# Patient Record
Sex: Female | Born: 1940 | Race: White | Hispanic: No | Smoking: Former smoker
Health system: Southern US, Community
[De-identification: ages and names within clinical notes are randomized; demographics above are authoritative.]

## PROBLEM LIST (undated history)

## (undated) DIAGNOSIS — R519 Headache, unspecified: Secondary | ICD-10-CM

## (undated) DIAGNOSIS — J449 Chronic obstructive pulmonary disease, unspecified: Secondary | ICD-10-CM

## (undated) DIAGNOSIS — I341 Nonrheumatic mitral (valve) prolapse: Secondary | ICD-10-CM

## (undated) DIAGNOSIS — J45909 Unspecified asthma, uncomplicated: Secondary | ICD-10-CM

## (undated) DIAGNOSIS — D649 Anemia, unspecified: Secondary | ICD-10-CM

## (undated) DIAGNOSIS — C060 Malignant neoplasm of cheek mucosa: Secondary | ICD-10-CM

## (undated) DIAGNOSIS — K219 Gastro-esophageal reflux disease without esophagitis: Secondary | ICD-10-CM

## (undated) DIAGNOSIS — Z973 Presence of spectacles and contact lenses: Secondary | ICD-10-CM

## (undated) DIAGNOSIS — R011 Cardiac murmur, unspecified: Secondary | ICD-10-CM

## (undated) DIAGNOSIS — E785 Hyperlipidemia, unspecified: Secondary | ICD-10-CM

## (undated) DIAGNOSIS — F32A Depression, unspecified: Secondary | ICD-10-CM

## (undated) DIAGNOSIS — Z720 Tobacco use: Secondary | ICD-10-CM

## (undated) DIAGNOSIS — F419 Anxiety disorder, unspecified: Secondary | ICD-10-CM

## (undated) HISTORY — PX: EYE SURGERY: SHX253

## (undated) HISTORY — PX: CARDIAC CATHETERIZATION: SHX172

## (undated) HISTORY — PX: DILATION AND CURETTAGE OF UTERUS: SHX78

---

## 2001-03-12 ENCOUNTER — Ambulatory Visit (HOSPITAL_COMMUNITY): Admission: RE | Admit: 2001-03-12 | Discharge: 2001-03-12 | Payer: Self-pay | Admitting: Internal Medicine

## 2005-01-10 ENCOUNTER — Ambulatory Visit: Payer: Self-pay | Admitting: Internal Medicine

## 2010-07-25 DIAGNOSIS — C060 Malignant neoplasm of cheek mucosa: Secondary | ICD-10-CM

## 2010-07-25 HISTORY — DX: Malignant neoplasm of cheek mucosa: C06.0

## 2010-10-24 HISTORY — PX: EXCISION OF ORAL TUMOR: SHX6269

## 2010-10-29 ENCOUNTER — Other Ambulatory Visit (HOSPITAL_COMMUNITY): Payer: Self-pay | Admitting: Otolaryngology

## 2010-10-29 ENCOUNTER — Encounter (HOSPITAL_COMMUNITY)
Admission: RE | Admit: 2010-10-29 | Discharge: 2010-10-29 | Disposition: A | Discharge status: Home / Self Care | Payer: Medicare Other | Source: Ambulatory Visit | Attending: Otolaryngology | Admitting: Otolaryngology

## 2010-10-29 ENCOUNTER — Ambulatory Visit (HOSPITAL_COMMUNITY)
Admission: RE | Admit: 2010-10-29 | Discharge: 2010-10-29 | Disposition: A | Discharge status: Home / Self Care | Payer: Medicare Other | Source: Ambulatory Visit | Attending: Otolaryngology | Admitting: Otolaryngology

## 2010-10-29 DIAGNOSIS — C069 Malignant neoplasm of mouth, unspecified: Secondary | ICD-10-CM | POA: Insufficient documentation

## 2010-10-29 DIAGNOSIS — Z01818 Encounter for other preprocedural examination: Secondary | ICD-10-CM | POA: Insufficient documentation

## 2010-10-29 DIAGNOSIS — J438 Other emphysema: Secondary | ICD-10-CM | POA: Insufficient documentation

## 2010-10-29 DIAGNOSIS — Z01812 Encounter for preprocedural laboratory examination: Secondary | ICD-10-CM | POA: Insufficient documentation

## 2010-10-29 LAB — SURGICAL PCR SCREEN
MRSA, PCR: NEGATIVE
Staphylococcus aureus: NEGATIVE

## 2010-10-29 LAB — CBC
Hemoglobin: 13.4 g/dL (ref 12.0–15.0)
RBC: 4.41 MIL/uL (ref 3.87–5.11)

## 2010-11-02 ENCOUNTER — Ambulatory Visit (HOSPITAL_COMMUNITY)
Admission: RE | Admit: 2010-11-02 | Discharge: 2010-11-02 | Disposition: A | Discharge status: Home / Self Care | Payer: Medicare Other | Source: Ambulatory Visit | Attending: Otolaryngology | Admitting: Otolaryngology

## 2010-11-02 DIAGNOSIS — K219 Gastro-esophageal reflux disease without esophagitis: Secondary | ICD-10-CM | POA: Insufficient documentation

## 2010-11-02 DIAGNOSIS — Z5309 Procedure and treatment not carried out because of other contraindication: Secondary | ICD-10-CM | POA: Insufficient documentation

## 2010-11-02 DIAGNOSIS — I059 Rheumatic mitral valve disease, unspecified: Secondary | ICD-10-CM | POA: Insufficient documentation

## 2010-11-02 DIAGNOSIS — C051 Malignant neoplasm of soft palate: Secondary | ICD-10-CM | POA: Insufficient documentation

## 2010-11-02 DIAGNOSIS — D3701 Neoplasm of uncertain behavior of lip: Secondary | ICD-10-CM | POA: Insufficient documentation

## 2010-11-02 DIAGNOSIS — C060 Malignant neoplasm of cheek mucosa: Secondary | ICD-10-CM | POA: Insufficient documentation

## 2010-11-02 DIAGNOSIS — D3709 Neoplasm of uncertain behavior of other specified sites of the oral cavity: Secondary | ICD-10-CM | POA: Insufficient documentation

## 2010-11-02 DIAGNOSIS — J45909 Unspecified asthma, uncomplicated: Secondary | ICD-10-CM | POA: Insufficient documentation

## 2010-11-02 DIAGNOSIS — R197 Diarrhea, unspecified: Secondary | ICD-10-CM | POA: Insufficient documentation

## 2010-11-08 ENCOUNTER — Other Ambulatory Visit: Payer: Self-pay | Admitting: Otolaryngology

## 2010-11-08 ENCOUNTER — Observation Stay (HOSPITAL_COMMUNITY)
Admission: RE | Admit: 2010-11-08 | Discharge: 2010-11-09 | Disposition: A | Discharge status: Home / Self Care | Payer: Medicare Other | Source: Ambulatory Visit | Attending: Otolaryngology | Admitting: Otolaryngology

## 2010-11-08 DIAGNOSIS — C060 Malignant neoplasm of cheek mucosa: Secondary | ICD-10-CM | POA: Insufficient documentation

## 2010-11-08 DIAGNOSIS — J45909 Unspecified asthma, uncomplicated: Secondary | ICD-10-CM | POA: Insufficient documentation

## 2010-11-08 DIAGNOSIS — K219 Gastro-esophageal reflux disease without esophagitis: Secondary | ICD-10-CM | POA: Insufficient documentation

## 2010-11-08 DIAGNOSIS — L439 Lichen planus, unspecified: Principal | ICD-10-CM | POA: Insufficient documentation

## 2010-11-08 DIAGNOSIS — I059 Rheumatic mitral valve disease, unspecified: Secondary | ICD-10-CM | POA: Insufficient documentation

## 2010-11-23 NOTE — Op Note (Signed)
Meredith Stewart, Meredith Stewart                ACCOUNT NO.:  [id number]  MEDICAL RECORD NO.:  [id number]           PATIENT TYPE:  O  LOCATION:  5128                         FACILITY:  [facility]  PHYSICIAN:  Braylei Totino H. 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  PROCEDURE:  Resection of buccogingival tumor with marginal mandibulectomy and CO2 laser ablation of left buccal mucosal lichen planus.  SURGEON:  Meris Reede H. Pollyann Kennedy, MD  ANESTHESIA:  General endotracheal anesthesia was used.  COMPLICATIONS:  None.  BLOOD LOSS:  About 200 mL.  FINDINGS:  Ulcerative and slightly raised nearly circular mass involving the lower posterior or buccal mucosa on the right with continuation onto the posterior alveolar ridge.  The resection margin was through the most posterior tooth on that side, this tooth was removed as well to facilitate the margin.  Specimen to pathology one is right buccogingival tumor, long double suture marked the anterior buccal margin and the short double suture marked the medial margin.  Frozen section all four quadrants sent separately were all negative for tumor.  The medial margin had some dysplastic mucosa, but no carcinoma.  REFERRING PHYSICIAN:  Hewitt Blade, DDS  HISTORY:  This is a 70 year old lady with about a 30-year history of chronic lichen planus.  There are some changes noted on the right side recently and the biopsy confirmed that this was squamous cell carcinoma. Risks, benefits, alternatives, and complications to the procedure were explained to the patient, seemed to understand and agreed to surgery.  PROCEDURE:  The patient was taken to the operating room, placed in the operating table in supine position.  Following induction of  general endotracheal anesthesia, the patient was prepped and draped in standard fashion.  A left-sided bite block was used to keep the teeth apart. With retraction of the cheek and tongue, the tumor was identified and surrounding the entire lesion mucosal marks were made with the electrocautery to mark the proposed incisions keeping adequate margins. The mucosa was all incised around the entire lesion.  The posterior most tooth which believe was a premolar was extracted using dental ligament dissectors and then tooth extraction forceps.  The entire tooth came with the root nicely.  The mucosal cuts were continued down through the submucosal tissue along the mandible down through the periosteum and more laterally into the buccal fat pad.  The entire soft tissue dissection was continued down to the mandible.  The mandibulectomy was then performed using a side cutting bur and the TPS drill and was completed using a curved osteotome.  Once the bone was separated, the medial deep tissue cuts were created using electrocautery.  The specimen was sent for pathologic evaluation.  The four quadrant frozen section samples were taken as well.  A 4-0 silk ties, bipolar cautery and bone wax were used for hemostasis.  The edges of the mandible were rounded using a round cutting bur.  The wound was irrigated.  The primary closure was then accomplished using interrupted 3-0 Vicryl suture. There was good apposition of the tissues.  Because of the mandibulectomy, the defect was nicely.  There was no tension on the wound.  There was no significant bleeding.  The oral cavity was irrigated with saline.  Left CO2 laser ablation, lichen planus.  The CO2 laser handpiece with the setting of 4 watts continuous was then used to ablate the lichen planus in the left buccal mucosa.  There was an irregularly shaped area approximately 3-4 cm in greatest dimension.  There was no ulceration or palpable mass and nothing  that required biopsy.  The mucosa was ablated with laser and the submucosal tissue looked healthy.  There was no bleeding.  Wet high protectors and wet towels were used for protection of the face and the eyes.  The patient was awakened, extubated, and transferred to recovery in stable condition.     Lucy Woolever H. Pollyann Kennedy, MD     JHR/MEDQ  D:  11/08/2010  T:  11/08/2010  Job:  [id number]  cc:   Hewitt Blade, D.D.S.  Electronically Signed by Serena Colonel MD on 11/23/2010 09:24:29 PM

## 2010-12-31 ENCOUNTER — Encounter: Payer: Self-pay | Admitting: Internal Medicine

## 2011-06-08 ENCOUNTER — Ambulatory Visit (INDEPENDENT_AMBULATORY_CARE_PROVIDER_SITE_OTHER): Payer: Medicare Other | Admitting: Ophthalmology

## 2011-06-08 DIAGNOSIS — H43819 Vitreous degeneration, unspecified eye: Secondary | ICD-10-CM

## 2011-06-08 DIAGNOSIS — H3530 Unspecified macular degeneration: Secondary | ICD-10-CM

## 2011-06-08 DIAGNOSIS — H2510 Age-related nuclear cataract, unspecified eye: Secondary | ICD-10-CM

## 2011-11-01 IMAGING — CR DG CHEST 2V
2 series · 2 of 2 positions shown · non-contrast
Comparison: None.

CLINICAL DATA: Emphysema.  Preop.  Oral cancer.  145.9.

CHEST - 2 VIEW

[view not recorded (1 of 2)]
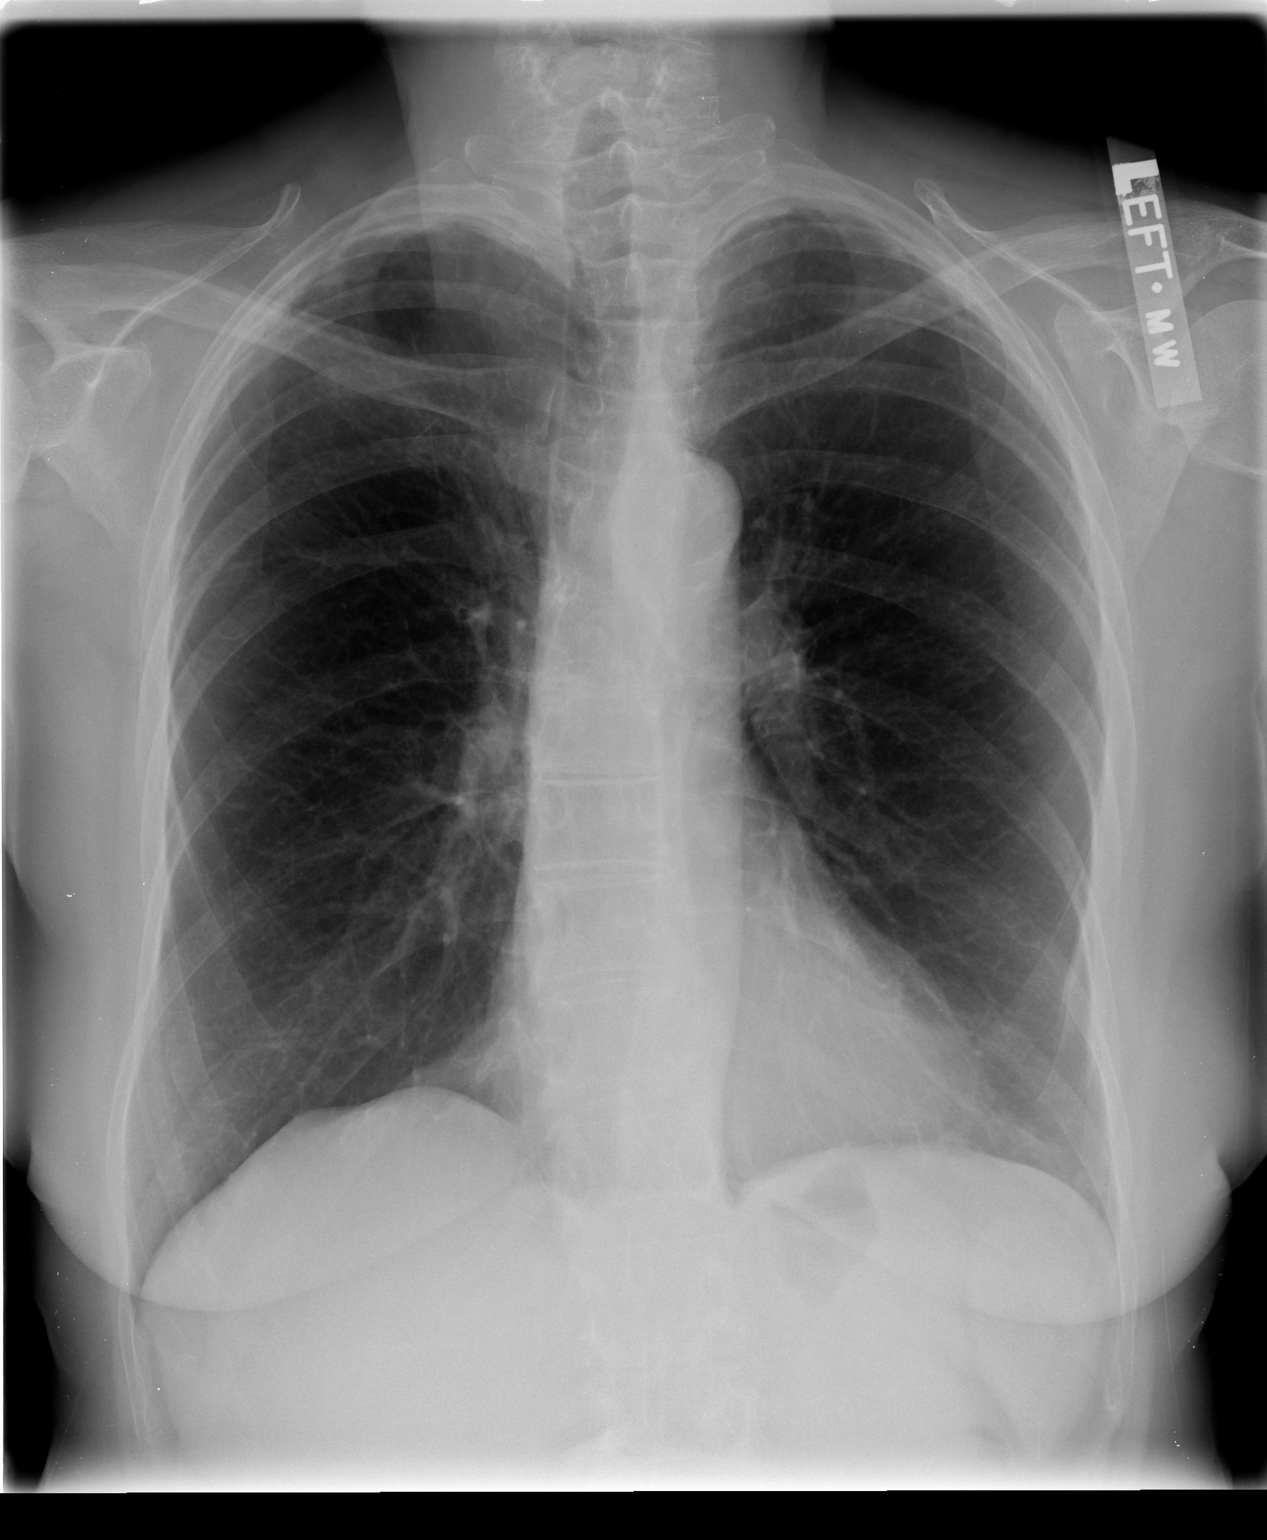

[view not recorded (2 of 2)]
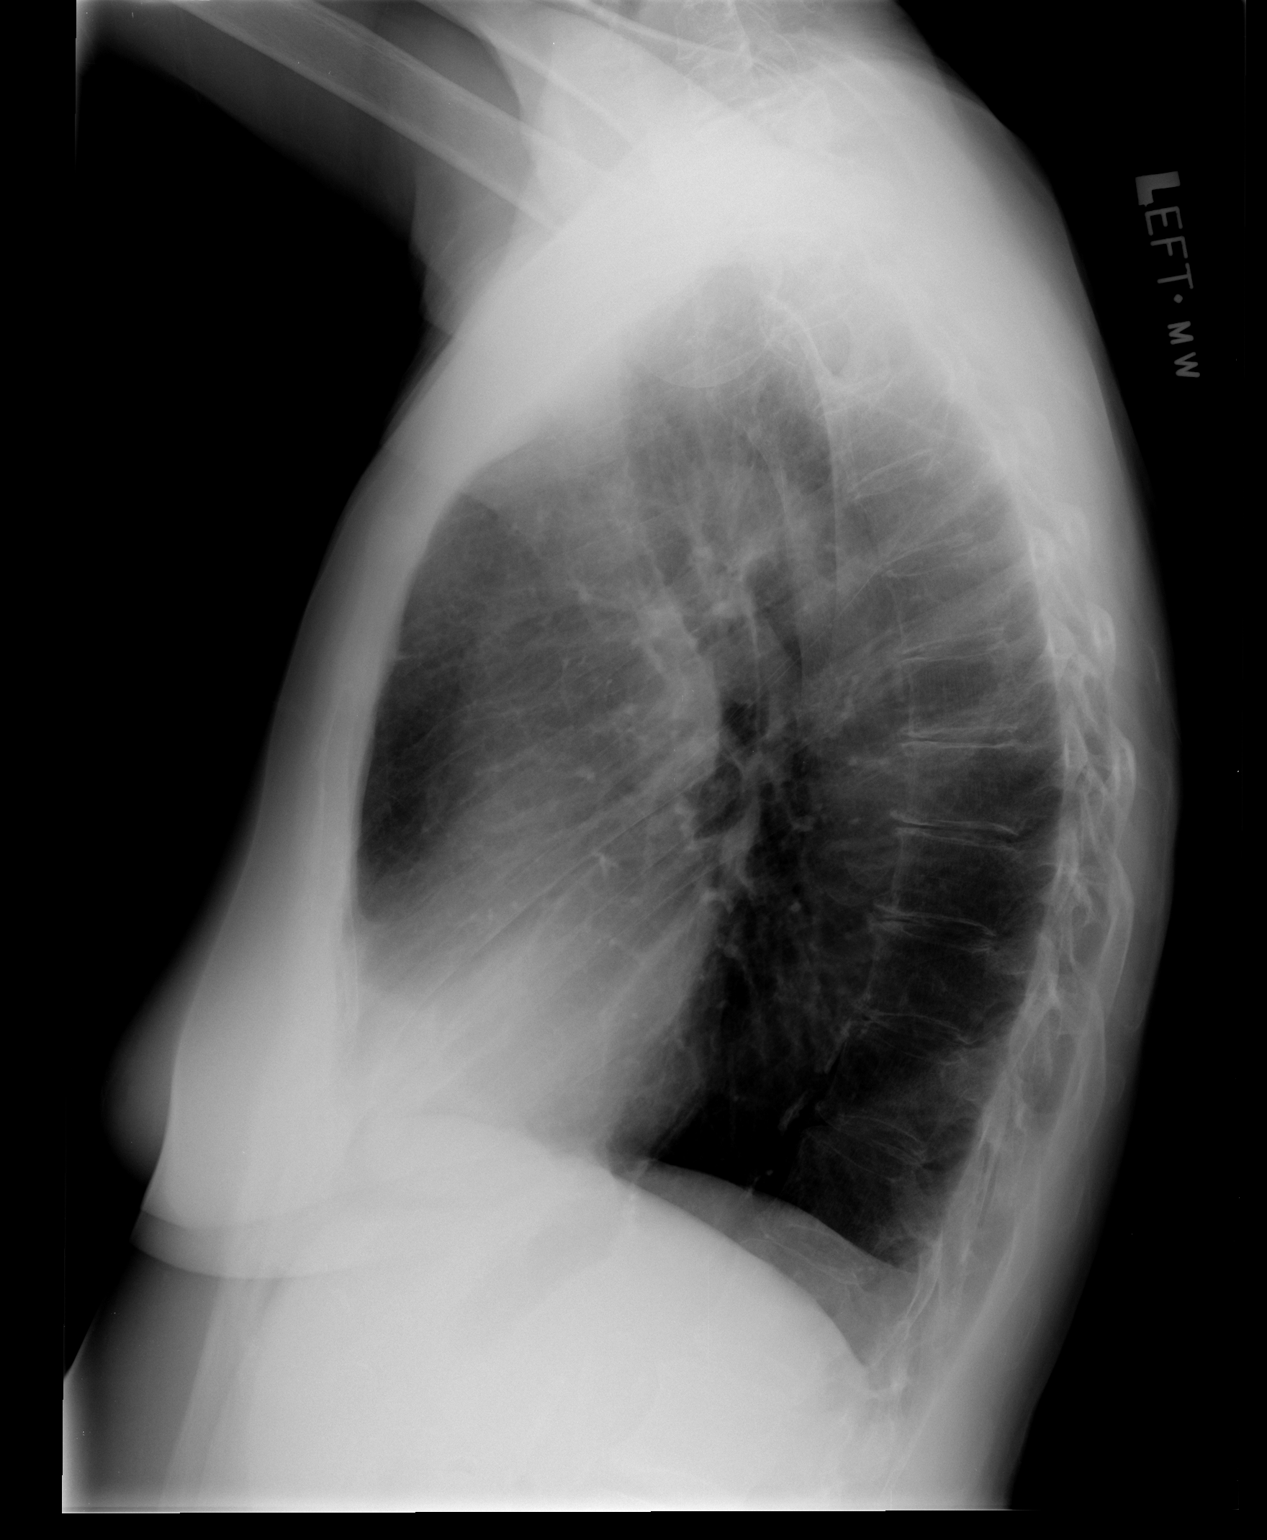

[2 of 2 positions shown; findings below may reference images not displayed]

FINDINGS: The heart size is normal.  Emphysematous changes are
present in the lungs.  There is some scarring at the apices
bilaterally.  No focal nodule or airspace disease is present.  Mild
degenerative changes are noted in the thoracolumbar spine along
with slight curvature.  Atherosclerotic calcifications are noted in
the descending aorta.  The visualized soft tissues and bony thorax
are otherwise unremarkable.
IMPRESSION: 1.  Emphysema.
2.  No acute or focal airspace disease.
3.  Atherosclerosis.

## 2011-12-06 ENCOUNTER — Ambulatory Visit (INDEPENDENT_AMBULATORY_CARE_PROVIDER_SITE_OTHER): Payer: Medicare Other | Admitting: Ophthalmology

## 2011-12-06 DIAGNOSIS — H43819 Vitreous degeneration, unspecified eye: Secondary | ICD-10-CM

## 2011-12-06 DIAGNOSIS — H2510 Age-related nuclear cataract, unspecified eye: Secondary | ICD-10-CM

## 2011-12-06 DIAGNOSIS — H3530 Unspecified macular degeneration: Secondary | ICD-10-CM

## 2012-05-17 ENCOUNTER — Encounter (HOSPITAL_COMMUNITY): Payer: Self-pay | Admitting: Internal Medicine

## 2012-05-17 ENCOUNTER — Observation Stay (HOSPITAL_COMMUNITY)
Admission: AD | Admit: 2012-05-17 | Discharge: 2012-05-18 | Disposition: A | Discharge status: Home / Self Care | Payer: Medicare Other | Source: Other Acute Inpatient Hospital | Attending: Cardiology | Admitting: Cardiology

## 2012-05-17 ENCOUNTER — Observation Stay (HOSPITAL_COMMUNITY): Payer: Medicare Other

## 2012-05-17 DIAGNOSIS — K219 Gastro-esophageal reflux disease without esophagitis: Secondary | ICD-10-CM | POA: Insufficient documentation

## 2012-05-17 DIAGNOSIS — R9431 Abnormal electrocardiogram [ECG] [EKG]: Secondary | ICD-10-CM | POA: Insufficient documentation

## 2012-05-17 DIAGNOSIS — F17200 Nicotine dependence, unspecified, uncomplicated: Secondary | ICD-10-CM | POA: Insufficient documentation

## 2012-05-17 DIAGNOSIS — E876 Hypokalemia: Secondary | ICD-10-CM | POA: Insufficient documentation

## 2012-05-17 DIAGNOSIS — R079 Chest pain, unspecified: Principal | ICD-10-CM | POA: Insufficient documentation

## 2012-05-17 DIAGNOSIS — R7989 Other specified abnormal findings of blood chemistry: Secondary | ICD-10-CM | POA: Diagnosis present

## 2012-05-17 DIAGNOSIS — Z720 Tobacco use: Secondary | ICD-10-CM | POA: Insufficient documentation

## 2012-05-17 DIAGNOSIS — E785 Hyperlipidemia, unspecified: Secondary | ICD-10-CM | POA: Diagnosis present

## 2012-05-17 DIAGNOSIS — R945 Abnormal results of liver function studies: Secondary | ICD-10-CM | POA: Diagnosis present

## 2012-05-17 DIAGNOSIS — R0789 Other chest pain: Secondary | ICD-10-CM | POA: Diagnosis present

## 2012-05-17 HISTORY — DX: Malignant neoplasm of cheek mucosa: C06.0

## 2012-05-17 HISTORY — DX: Gastro-esophageal reflux disease without esophagitis: K21.9

## 2012-05-17 HISTORY — DX: Hyperlipidemia, unspecified: E78.5

## 2012-05-17 HISTORY — DX: Tobacco use: Z72.0

## 2012-05-17 MED ORDER — ACETAMINOPHEN 325 MG PO TABS
650.0000 mg | ORAL_TABLET | ORAL | Status: DC | PRN
  Administered 2012-05-18: 650 mg via ORAL
  Filled 2012-05-17: qty 2

## 2012-05-17 MED ORDER — ASPIRIN 81 MG PO CHEW
324.0000 mg | CHEWABLE_TABLET | ORAL | Status: AC
  Administered 2012-05-18: 324 mg via ORAL
  Filled 2012-05-17: qty 4

## 2012-05-17 MED ORDER — OCUVITE-LUTEIN PO CAPS
1.0000 | ORAL_CAPSULE | Freq: Every day | ORAL | Status: DC
  Administered 2012-05-18: 1 via ORAL
  Filled 2012-05-17: qty 1

## 2012-05-17 MED ORDER — SODIUM CHLORIDE 0.9 % IJ SOLN: 3.0000 mL | INTRAMUSCULAR | Status: DC | PRN

## 2012-05-17 MED ORDER — VITAMIN D 50 MCG (2000 UT) PO TABS: 2000.0000 [IU] | ORAL_TABLET | Freq: Every day | ORAL | Status: DC

## 2012-05-17 MED ORDER — ASPIRIN 300 MG RE SUPP
300.0000 mg | RECTAL | Status: AC
  Filled 2012-05-17: qty 1

## 2012-05-17 MED ORDER — VITAMIN D3 25 MCG (1000 UNIT) PO TABS
2000.0000 [IU] | ORAL_TABLET | Freq: Every day | ORAL | Status: DC
  Administered 2012-05-18: 2000 [IU] via ORAL
  Filled 2012-05-17: qty 2

## 2012-05-17 MED ORDER — ATORVASTATIN CALCIUM 80 MG PO TABS
80.0000 mg | ORAL_TABLET | Freq: Every day | ORAL | Status: DC
  Administered 2012-05-18: 80 mg via ORAL
  Filled 2012-05-17: qty 1

## 2012-05-17 MED ORDER — LORATADINE 10 MG PO TABS
10.0000 mg | ORAL_TABLET | Freq: Every day | ORAL | Status: DC
  Administered 2012-05-18: 10 mg via ORAL
  Filled 2012-05-17: qty 1

## 2012-05-17 MED ORDER — FAMOTIDINE 20 MG PO TABS
20.0000 mg | ORAL_TABLET | Freq: Every day | ORAL | Status: DC
  Administered 2012-05-18: 20 mg via ORAL
  Filled 2012-05-17 (×2): qty 1

## 2012-05-17 MED ORDER — ALBUTEROL SULFATE HFA 108 (90 BASE) MCG/ACT IN AERS
1.0000 | INHALATION_SPRAY | Freq: Four times a day (QID) | RESPIRATORY_TRACT | Status: DC | PRN
  Administered 2012-05-18: 1 via RESPIRATORY_TRACT
  Filled 2012-05-17: qty 6.7

## 2012-05-17 MED ORDER — SODIUM CHLORIDE 0.9 % IJ SOLN
3.0000 mL | Freq: Two times a day (BID) | INTRAMUSCULAR | Status: DC
  Administered 2012-05-18 (×2): 3 mL via INTRAVENOUS

## 2012-05-17 MED ORDER — ONDANSETRON HCL 4 MG/2ML IJ SOLN: 4.0000 mg | Freq: Four times a day (QID) | INTRAMUSCULAR | Status: DC | PRN

## 2012-05-17 MED ORDER — FLUTICASONE-SALMETEROL 250-50 MCG/DOSE IN AEPB
1.0000 | INHALATION_SPRAY | Freq: Two times a day (BID) | RESPIRATORY_TRACT | Status: DC
  Administered 2012-05-18: 1 via RESPIRATORY_TRACT
  Filled 2012-05-17: qty 14

## 2012-05-17 MED ORDER — SODIUM CHLORIDE 0.9 % IV SOLN: 250.0000 mL | INTRAVENOUS | Status: DC | PRN

## 2012-05-17 MED ORDER — ASPIRIN EC 81 MG PO TBEC
81.0000 mg | DELAYED_RELEASE_TABLET | Freq: Every day | ORAL | Status: DC
  Filled 2012-05-17: qty 1

## 2012-05-17 NOTE — H&P (Addendum)
Meredith Stewart is an 71 y.o. female.   Chief Complaint: chest pain and + Lexiscan Perfusion study HPI: 71 yo woman with PMH only for GERD and squamous cell carcinoma of buccal mucosa with 35 years tobacco use, quit 12 years ago who had early morning chest pain day prior to transfer here at [name]. She tells me she had chest pain for approximately 2 hours - substernal and in her abdomen - somewhat like GERD she's had but more severe. There was some associated retrosternal back pain. She took some NTG at the hospital because of some chest pain recurrence l but it dropped her blood pressure and per chart review may have caused ST changes. She was observed/admitted to [name] and apparently had negative cardiac biomarkers x 3 (I reviewed 2 in our chart here) and had a lexiscan myocardial perfusion study which revealed a small fixed defect (? Artifact) but positive ECG changes with lexiscan administration and a concern for balanced ischemia so she was transferred to [name] for a left heart catheterization. Otherwise, she sleeps on 1 pillow, can walk in excess of 1 mile and has no problems walking up stairs.   Past Medical History  Diagnosis Date  . GERD (gastroesophageal reflux disease)   . Squamous cell carcinoma of buccal mucosa 2012  . Tobacco abuse     smoked x 35 years, quit 12 years ago    No past surgical history on file. No known family history of HTN, T2DM or CAD No family history on file. Social History:  does not have a smoking history on file. She does not have any smokeless tobacco history on file. Her alcohol and drug histories not on file.  Allergies:  Allergies  Allergen Reactions  . Sulfa Antibiotics     unknown    Medications Prior to Admission  Medication Sig Dispense Refill  . albuterol (PROVENTIL HFA;VENTOLIN HFA) 108 (90 BASE) MCG/ACT inhaler Inhale 1 puff into the lungs daily.      . Cholecalciferol (VITAMIN D) 2000 UNITS tablet Take 2,000 Units by mouth  daily.      . famotidine (PEPCID) 20 MG tablet Take 20 mg by mouth daily as needed. For upset stomach      . fish oil-omega-3 fatty acids 1000 MG capsule Take 1 g by mouth daily.      . Fluticasone-Salmeterol (ADVAIR) 250-50 MCG/DOSE AEPB Inhale 1 puff into the lungs every 12 (twelve) hours.      Marland Kitchen loratadine (CLARITIN) 10 MG tablet Take 10 mg by mouth daily.      . multivitamin-lutein (OCUVITE-LUTEIN) CAPS Take 1 capsule by mouth daily.        No results found for this or any previous visit (from the past 48 hour(s)). No results found.  Review of Systems  Constitutional: Negative for fever, chills and weight loss.  HENT: Negative for hearing loss, ear pain, neck pain and tinnitus.   Eyes: Negative for blurred vision, double vision and photophobia.  Respiratory: Negative for cough, hemoptysis, sputum production and shortness of breath.   Cardiovascular: Positive for chest pain. Negative for palpitations, orthopnea, claudication and leg swelling.  Gastrointestinal: Positive for heartburn and vomiting. Negative for nausea and abdominal pain.  Genitourinary: Negative for dysuria, urgency and frequency.  Musculoskeletal: Negative for myalgias.  Skin: Negative for itching and rash.  Neurological: Positive for headaches. Negative for dizziness, tingling and tremors.  Endo/Heme/Allergies: Negative for environmental allergies and polydipsia. Does not bruise/bleed easily.  Psychiatric/Behavioral: Negative for depression,  suicidal ideas and substance abuse.    Blood pressure 150/80, pulse 75, temperature 99.7 F (37.6 C), temperature source Oral, weight 65.681 kg (144 lb 12.8 oz), SpO2 95.00%. Physical Exam  Nursing note and vitals reviewed. Constitutional: She is oriented to person, place, and time. She appears well-developed and well-nourished. No distress.  HENT:  Head: Normocephalic and atraumatic.  Nose: Nose normal.  Mouth/Throat: No oropharyngeal exudate.  Eyes: Conjunctivae normal  and EOM are normal. Pupils are equal, round, and reactive to light. No scleral icterus.  Neck: Normal range of motion. Neck supple. No JVD present. No tracheal deviation present. No thyromegaly present.  Cardiovascular: Normal rate, regular rhythm, normal heart sounds and intact distal pulses.  Exam reveals no gallop and no friction rub.   No murmur heard. Respiratory: Effort normal and breath sounds normal. No respiratory distress. She has no wheezes. She has no rales.  GI: Soft. Bowel sounds are normal. She exhibits no distension. There is no tenderness. There is no rebound.  Musculoskeletal: Normal range of motion. She exhibits no edema and no tenderness.  Neurological: She is alert and oriented to person, place, and time. She has normal reflexes. No cranial nerve deficit.  Skin: Skin is warm and dry. No rash noted. She is not diaphoretic. No erythema.  Psychiatric: She has a normal mood and affect. Her behavior is normal.    Labs reviewed from OSH - bun/cr 0.83, troponin negative x2 that I saw CTA negative for PE; 3 mm LUL pulmonary nodule Nuclear study read reviewed - in chart: small fixed defect apical, anterior, apex - ? Attenuation artifact EF 67% + ECG abnormalities mentioned with lexiscan administration - HR response to 145, no symptoms Concern for balanced ischemia  Problem List Chest Pain with + Lexiscan Nuclear study GERD Prior tobacco abuse Prior Squamous Cell carcinoma of buccal mucosa  Assessment/Plan 71 yo woman with prior tobacco abuse, GERD and chest pain with + ECG changes on Lexiscan administration and concern for balanced ischemia transferred to [facility] for Cardiac Catheterization. She has significant risk factors with age and tobacco abuse with + nuclear study. Will plan for cardiac catheterization in the morning. She has a remote history of blood pressure decreased in '86 with contrast administration related to kidney evaluation as a possible donor as a family  member. However, she tolerated her CTA at [name] without difficulty. The procedure, risks and benefits of cardiac catheterization were discussed with Ms. Mcelhiney and her husband at the end of my history and physical examination.  - NPO after midnight - asa 81 mg PO daily, 325 mg to be given now just in case - continue PPI - telemetry - PRN percocet for pain - hba1c, tsh, coags, cbc, cmp, EKG, Chest x-ray  - defer echo given EF from nuclear study and possible LV gram tomrorow. No murmurs on exam.  - no pharmacologic VTE ppx given ambulatory status and likely heparinization with catheterization in AM  Benny Deutschman 05/17/2012, 11:02 PM  AST/ALT noted to be up - atorvastatin discontinued although she appeared to have received dose this evening. Will need to follow up LFTS to assess etiology. ? Abd u/s if not in the system.

## 2012-05-18 ENCOUNTER — Encounter (HOSPITAL_COMMUNITY)
Admission: AD | Disposition: A | Discharge status: Home / Self Care | Payer: Self-pay | Source: Other Acute Inpatient Hospital | Attending: Cardiology

## 2012-05-18 ENCOUNTER — Encounter (HOSPITAL_COMMUNITY): Payer: Self-pay

## 2012-05-18 DIAGNOSIS — R0789 Other chest pain: Secondary | ICD-10-CM | POA: Diagnosis present

## 2012-05-18 DIAGNOSIS — E785 Hyperlipidemia, unspecified: Secondary | ICD-10-CM

## 2012-05-18 DIAGNOSIS — R079 Chest pain, unspecified: Secondary | ICD-10-CM

## 2012-05-18 DIAGNOSIS — R945 Abnormal results of liver function studies: Secondary | ICD-10-CM | POA: Diagnosis present

## 2012-05-18 HISTORY — PX: LEFT HEART CATHETERIZATION WITH CORONARY ANGIOGRAM: SHX5451

## 2012-05-18 LAB — HEMOGLOBIN A1C: Hgb A1c MFr Bld: 5.5 % (ref <5.7)

## 2012-05-18 LAB — COMPREHENSIVE METABOLIC PANEL
Albumin: 3.7 g/dL (ref 3.5–5.2)
Alkaline Phosphatase: 159 U/L — ABNORMAL HIGH (ref 39–117)
BUN: 8 mg/dL (ref 6–23)
Chloride: 94 mEq/L — ABNORMAL LOW (ref 96–112)
Creatinine, Ser: 0.56 mg/dL (ref 0.50–1.10)
GFR calc Af Amer: >90 mL/min (ref >90)
Glucose, Bld: 113 mg/dL — ABNORMAL HIGH (ref 70–99)
Potassium: 3.3 mEq/L — ABNORMAL LOW (ref 3.5–5.1)
Total Bilirubin: 0.6 mg/dL (ref 0.3–1.2)

## 2012-05-18 LAB — CBC WITH DIFFERENTIAL/PLATELET
Basophils Absolute: 0 10*3/uL (ref 0.0–0.1)
Basophils Relative: 1 % (ref 0–1)
Eosinophils Relative: 3 % (ref 0–5)
HCT: 36 % (ref 36.0–46.0)
Hemoglobin: 12.6 g/dL (ref 12.0–15.0)
Lymphocytes Relative: 22 % (ref 12–46)
MCHC: 35 g/dL (ref 30.0–36.0)
MCV: 87.4 fL (ref 78.0–100.0)
Monocytes Absolute: 0.5 10*3/uL (ref 0.1–1.0)
Monocytes Relative: 7 % (ref 3–12)
Neutro Abs: 5.3 10*3/uL (ref 1.7–7.7)
RDW: 12.4 % (ref 11.5–15.5)

## 2012-05-18 LAB — BASIC METABOLIC PANEL
Calcium: 9.4 mg/dL (ref 8.4–10.5)
Calcium: 9.9 mg/dL (ref 8.4–10.5)
GFR calc Af Amer: >90 mL/min (ref >90)
GFR calc Af Amer: >90 mL/min (ref >90)
GFR calc non Af Amer: >90 mL/min (ref >90)
GFR calc non Af Amer: 88 mL/min — ABNORMAL LOW (ref >90)
Glucose, Bld: 96 mg/dL (ref 70–99)
Potassium: 3.7 mEq/L (ref 3.5–5.1)
Sodium: 131 mEq/L — ABNORMAL LOW (ref 135–145)
Sodium: 132 mEq/L — ABNORMAL LOW (ref 135–145)

## 2012-05-18 LAB — MAGNESIUM: Magnesium: 1.7 mg/dL (ref 1.5–2.5)

## 2012-05-18 LAB — LIPID PANEL
HDL: 70 mg/dL (ref >39)
LDL Cholesterol: 134 mg/dL — ABNORMAL HIGH (ref 0–99)
Total CHOL/HDL Ratio: 3.2 RATIO
VLDL: 17 mg/dL (ref 0–40)

## 2012-05-18 SURGERY — LEFT HEART CATHETERIZATION WITH CORONARY ANGIOGRAM
Anesthesia: LOCAL

## 2012-05-18 MED ORDER — MIDAZOLAM HCL 2 MG/2ML IJ SOLN
INTRAMUSCULAR | Status: AC
  Filled 2012-05-18: qty 2

## 2012-05-18 MED ORDER — SODIUM CHLORIDE 0.9 % IJ SOLN: 3.0000 mL | INTRAMUSCULAR | Status: DC | PRN

## 2012-05-18 MED ORDER — HEPARIN (PORCINE) IN NACL 2-0.9 UNIT/ML-% IJ SOLN
INTRAMUSCULAR | Status: AC
  Filled 2012-05-18: qty 1000

## 2012-05-18 MED ORDER — VERAPAMIL HCL 2.5 MG/ML IV SOLN
INTRAVENOUS | Status: AC
  Filled 2012-05-18: qty 2

## 2012-05-18 MED ORDER — SODIUM CHLORIDE 0.45 % IV SOLN: INTRAVENOUS | Status: AC

## 2012-05-18 MED ORDER — SODIUM CHLORIDE 0.9 % IV SOLN: 250.0000 mL | INTRAVENOUS | Status: DC | PRN

## 2012-05-18 MED ORDER — SODIUM CHLORIDE 0.9 % IV SOLN
1.0000 mL/kg/h | INTRAVENOUS | Status: DC
  Administered 2012-05-18: 1 mL/kg/h via INTRAVENOUS

## 2012-05-18 MED ORDER — LIDOCAINE HCL (PF) 1 % IJ SOLN
INTRAMUSCULAR | Status: AC
  Filled 2012-05-18: qty 30

## 2012-05-18 MED ORDER — ASPIRIN 81 MG PO CHEW: 324.0000 mg | CHEWABLE_TABLET | ORAL | Status: DC

## 2012-05-18 MED ORDER — SODIUM CHLORIDE 0.9 % IJ SOLN
3.0000 mL | Freq: Two times a day (BID) | INTRAMUSCULAR | Status: DC
  Administered 2012-05-18: 3 mL via INTRAVENOUS

## 2012-05-18 MED ORDER — ASPIRIN EC 325 MG PO TBEC: 325.0000 mg | DELAYED_RELEASE_TABLET | Freq: Every day | ORAL | Status: DC

## 2012-05-18 MED ORDER — ACETAMINOPHEN 325 MG PO TABS: 650.0000 mg | ORAL_TABLET | ORAL | Status: DC | PRN

## 2012-05-18 MED ORDER — FENTANYL CITRATE 0.05 MG/ML IJ SOLN
INTRAMUSCULAR | Status: AC
  Filled 2012-05-18: qty 2

## 2012-05-18 MED ORDER — NITROGLYCERIN 0.2 MG/ML ON CALL CATH LAB
INTRAVENOUS | Status: AC
  Filled 2012-05-18: qty 1

## 2012-05-18 MED ORDER — HEPARIN SODIUM (PORCINE) 1000 UNIT/ML IJ SOLN
INTRAMUSCULAR | Status: AC
  Filled 2012-05-18: qty 1

## 2012-05-18 MED ORDER — ONDANSETRON HCL 4 MG/2ML IJ SOLN: 4.0000 mg | Freq: Four times a day (QID) | INTRAMUSCULAR | Status: DC | PRN

## 2012-05-18 MED ORDER — DIAZEPAM 2 MG PO TABS: 2.0000 mg | ORAL_TABLET | ORAL | Status: DC | PRN

## 2012-05-18 MED ORDER — ASPIRIN 81 MG PO CHEW
CHEWABLE_TABLET | ORAL | Status: AC
  Filled 2012-05-18: qty 4

## 2012-05-18 MED ORDER — POTASSIUM CHLORIDE CRYS ER 20 MEQ PO TBCR
40.0000 meq | EXTENDED_RELEASE_TABLET | ORAL | Status: AC
  Administered 2012-05-18 (×2): 40 meq via ORAL
  Filled 2012-05-18 (×2): qty 2

## 2012-05-18 MED ORDER — ASPIRIN EC 81 MG PO TBEC: 81.0000 mg | DELAYED_RELEASE_TABLET | Freq: Every day | ORAL | Status: DC

## 2012-05-18 MED ORDER — ASPIRIN 81 MG PO CHEW
324.0000 mg | CHEWABLE_TABLET | ORAL | Status: AC
  Administered 2012-05-18: 324 mg via ORAL

## 2012-05-18 NOTE — Progress Notes (Signed)
Right radial dressing dry/intact with no bleeding or hematoma present. D/c instructions provided. Patient transported to main lobby via wheelchair for d/c home. Mamie Levers

## 2012-05-18 NOTE — Discharge Summary (Signed)
CARDIOLOGY DISCHARGE SUMMARY   Patient ID: Meredith Stewart MRN: [id number] DOB/AGE: 1941/02/23 71 y.o.  Admit date: 05/17/2012 Discharge date: 05/18/2012  Primary Discharge Diagnosis:  Chest pain, abnormal Myoview, only spasm seen on cath. Secondary Discharge Diagnosis:   Hyperlipidemia  Abnormal LFTs Hyponatremia  Procedures: Cardiac catheterization, coronary arteriogram, left ventriculogram  Hospital Course: Meredith Stewart is a 71 year old female with no previous history of coronary artery disease. She had chest pain and went to [facility]. Her biomarkers were negative but a Lexi scan Myoview showed a possible defect. She also had ECG changes with Lexi scan administration. She was transferred to [facility] for further evaluation and catheterization.  She had a cardiac catheterization on 05/18/2012. Dr. Eden Emms did not find any coronary artery disease and her EF was 70%. She had some vasospasm with injection of the dye which was relieved with nitroglycerin. The case was done from her radial artery and she had good hemostasis post-procedure. A lipid profile showed an elevated LDL. However, her LFTs were checked and were abnormal so no statin was added at this time. She is to followup with her family physician for this. She had mild hyponatremia, with a sodium as low as 130. She was asymptomatic and is to follow up with primary care for this. Her BNP had some elevation but she had no signs were sent them to volume overload. On 05/18/2012, post-cath, Meredith Stewart was ambulating without chest pain or shortness of breath and considered stable for discharge, to follow up as an outpatient.  Labs:   Lab Results  Component Value Date   WBC 7.7 05/17/2012   HGB 12.6 05/17/2012   HCT 36.0 05/17/2012   MCV 87.4 05/17/2012   PLT 263 05/17/2012     Lab 05/18/12 1000 05/17/12 2346  NA 132* --  K 4.3 --  CL 95* --  CO2 28 --  BUN 8 --  CREATININE 0.66 --  CALCIUM 9.9 --  PROT -- 6.4    BILITOT -- 0.6  ALKPHOS -- 159*  ALT -- 432*  AST -- 233*  GLUCOSE 96 --    Basename 05/17/12 2346  CKTOTAL --  CKMB --  CKMBINDEX --  TROPONINI <0.30   Lipid Panel     Component Value Date/Time   CHOL 221* 05/17/2012 2346   TRIG 84 05/17/2012 2346   HDL 70 05/17/2012 2346   CHOLHDL 3.2 05/17/2012 2346   VLDL 17 05/17/2012 2346   LDLCALC 134* 05/17/2012 2346    Pro B Natriuretic peptide (BNP)  Date/Time Value Range Status  05/17/2012 11:46 PM 611.1* 0 - 125 pg/mL Final    Basename 05/17/12 2346  INR 0.96      Radiology: Portable Chest X-ray 1 View 05/18/2012  *RADIOLOGY REPORT*  Clinical Data: Sudden onset of chest pain.  PORTABLE CHEST - 1 VIEW  Comparison: Chest radiograph and CT from 05/16/2012  Findings: Single view of the chest was obtained.  Stable appearance of the heart and mediastinum.  Slightly increased densities at the left costophrenic angle compared to the prior examination.  This may be related to some volume loss or densities from cardiophrenic fat.  Otherwise, the lungs are clear.  IMPRESSION: Left basilar densities may represent volume loss or overlying shadows.  Otherwise, no acute chest findings.   Original Report Authenticated By: Richarda Overlie, M.D.     Cardiac Cath: 05/18/2012 LM: Normal  LAD: Normal patient had spasm of the mid and distal vessel on initial injection Relieved with nitro.  Large septal perforator that parallels the LAD D1- Small and normal D2- Normal Circumflex: Normal  OM1- Large branching vessel Normal RCA: Normal  PDA- Normal PLB- Normal  Ventriculography: EF: 70%, hyperdynamic  EKG: 18-May-2012 00:19:36 [facility]20 ROUTINE RECORD Normal sinus rhythm Nonspecific ST abnormality Abnormal ECG No significant change since last tracing 7mm/s 40mm/mV 100Hz  8.0.1 12SL 241 HD CID: 1 Referred by: Confirmed By: Verdis Prime MD Vent. rate 74 BPM PR interval 160 ms QRS duration 84 ms QT/QTc 414/459 ms P-R-T axes  81 61 68  FOLLOW UP PLANS AND APPOINTMENTS Allergies  Allergen Reactions  . Azithromycin     rash  . Sulfa Antibiotics     unknown     Medication List     As of 05/18/2012  4:07 PM    TAKE these medications         albuterol 108 (90 BASE) MCG/ACT inhaler   Commonly known as: PROVENTIL HFA;VENTOLIN HFA   Inhale 1 puff into the lungs daily.      famotidine 20 MG tablet   Commonly known as: PEPCID   Take 20 mg by mouth daily as needed. For upset stomach      fish oil-omega-3 fatty acids 1000 MG capsule   Take 1 g by mouth daily.      Fluticasone-Salmeterol 250-50 MCG/DOSE Aepb   Commonly known as: ADVAIR   Inhale 1 puff into the lungs every 12 (twelve) hours.      loratadine 10 MG tablet   Commonly known as: CLARITIN   Take 10 mg by mouth daily.      multivitamin-lutein Caps   Take 1 capsule by mouth daily.      Vitamin D 2000 UNITS tablet   Take 2,000 Units by mouth daily.         Discharge Orders    Future Appointments: Provider: Department: Dept Phone: Center:   06/11/2012 7:30 AM Sherrie George, MD [facility] [phone] None     Follow-up Information    Follow up with [facility]. (As needed)    Contact information:   [address] [place] [address]          BRING ALL MEDICATIONS WITH YOU TO FOLLOW UP APPOINTMENTS  Time spent with patient to include physician time: 31 min Signed: Theodore Demark 05/18/2012, 4:07 PM Co-Sign MD

## 2012-05-18 NOTE — CV Procedure (Signed)
Catheterization  Name: Meredith Stewart MRN: [id number] DOB: 1940/12/31  Indication: Chest Pain ECG changes with Lexiscan  Procedure:  After informed consent and clinical "time out" the right radial artery was prepped and draped in a sterile fashion. Allen's test was documented to be normal both by manual compression and plethosmography prior to cannulation.  A 5Fr hydrophilic sheath was advanced using a .025 nitinol wire. Catheters were advanced into the ascending aortic root with a .035 J wire and fluoroscopic guidance.  Standard JL3.5, and JR4 catheters were used to engage the coronaries.  A standard angled pigtail catheter was used for ventriculography.  Coronary arteries were visualized in orthogonal views using caudal and cranial angulation.  RAO ventriculography was done using 24 cc of contrast.    Medications:   Versed: 3 mg's  Fentanyl: 25 ug's  Heparin: 3500 units  Verapamil: 3 mg's  Coronary Arteries:  Right dominant with no anomalies  LM: Normal  LAD: Normal patient had spasm of the mid and distal vessel on initial injection Relieved with nitro. Large septal perforator that parallels the LAD  D1- Small and normal  D2- Normal   Circumflex: Normal  OM1-  Large branching vessel Normal    RCA: Normal  PDA- Normal  PLB- Normal  Ventriculography: EF: 70%, hyperdynamic  Hemodynamics:  Aortic Pressure: 112 64 mmHg  LV Pressure: 114 12  mmHg  Impression:  No significant CAD.  Spasm with injection relieved with nitro.   Myovue images normal just had ECG changes with Lexiscan  D/C home  Later today  At the end of the case a TR band was placed with good hemostasis and flow to the hand including the radial aspect of the thumb.

## 2012-05-18 NOTE — Progress Notes (Signed)
Subjective: No CP or SOB at rest Objective: Filed Vitals:   05/17/12 2135 05/18/12 0423  BP: 150/80 112/70  Pulse: 75 72  Temp: 99.7 F (37.6 C) 98.5 F (36.9 C)  TempSrc: Oral Oral  Resp: 18 16  Height: 5\' 5"  (1.651 m)   Weight: 144 lb 12.8 oz (65.681 kg) 144 lb 12.8 oz (65.681 kg)  SpO2: 95% 94%   Weight change:  No intake or output data in the 24 hours ending 05/18/12 0817  General: Alert, awake, oriented x3, in no acute distress Neck:  JVP is normal Heart: Regular rate and rhythm, without murmurs, rubs, gallops.  Lungs: Clear to auscultation.  No rales or wheezes. Exemities:  No edema.   Neuro: Grossly intact, nonfocal.  Tele:  SR Lab Results: Results for orders placed during the hospital encounter of 05/17/12 (from the past 24 hour(s))  PROTIME-INR     Status: Normal   Collection Time   05/17/12 11:46 PM      Component Value Range   Prothrombin Time 12.7  11.6 - 15.2 seconds   INR 0.96  0.00 - 1.49  APTT     Status: Normal   Collection Time   05/17/12 11:46 PM      Component Value Range   aPTT 29  24 - 37 seconds  CBC WITH DIFFERENTIAL     Status: Normal   Collection Time   05/17/12 11:46 PM      Component Value Range   WBC 7.7  4.0 - 10.5 K/uL   RBC 4.12  3.87 - 5.11 MIL/uL   Hemoglobin 12.6  12.0 - 15.0 g/dL   HCT 81.1  91.4 - 78.2 %   MCV 87.4  78.0 - 100.0 fL   MCH 30.6  26.0 - 34.0 pg   MCHC 35.0  30.0 - 36.0 g/dL   RDW 95.6  21.3 - 08.6 %   Platelets 263  150 - 400 K/uL   Neutrophils Relative 68  43 - 77 %   Neutro Abs 5.3  1.7 - 7.7 K/uL   Lymphocytes Relative 22  12 - 46 %   Lymphs Abs 1.7  0.7 - 4.0 K/uL   Monocytes Relative 7  3 - 12 %   Monocytes Absolute 0.5  0.1 - 1.0 K/uL   Eosinophils Relative 3  0 - 5 %   Eosinophils Absolute 0.2  0.0 - 0.7 K/uL   Basophils Relative 1  0 - 1 %   Basophils Absolute 0.0  0.0 - 0.1 K/uL  COMPREHENSIVE METABOLIC PANEL     Status: Abnormal   Collection Time   05/17/12 11:46 PM      Component Value  Range   Sodium 130 (*) 135 - 145 mEq/L   Potassium 3.3 (*) 3.5 - 5.1 mEq/L   Chloride 94 (*) 96 - 112 mEq/L   CO2 26  19 - 32 mEq/L   Glucose, Bld 113 (*) 70 - 99 mg/dL   BUN 8  6 - 23 mg/dL   Creatinine, Ser 5.78  0.50 - 1.10 mg/dL   Calcium 9.4  8.4 - 46.9 mg/dL   Total Protein 6.4  6.0 - 8.3 g/dL   Albumin 3.7  3.5 - 5.2 g/dL   AST 629 (*) 0 - 37 U/L   ALT 432 (*) 0 - 35 U/L   Alkaline Phosphatase 159 (*) 39 - 117 U/L   Total Bilirubin 0.6  0.3 - 1.2 mg/dL   GFR calc non Af Amer >  90  >90 mL/min   GFR calc Af Amer >90  >90 mL/min  MAGNESIUM     Status: Normal   Collection Time   05/17/12 11:46 PM      Component Value Range   Magnesium 1.7  1.5 - 2.5 mg/dL  PRO B NATRIURETIC PEPTIDE     Status: Abnormal   Collection Time   05/17/12 11:46 PM      Component Value Range   Pro B Natriuretic peptide (BNP) 611.1 (*) 0 - 125 pg/mL  TROPONIN I     Status: Normal   Collection Time   05/17/12 11:46 PM      Component Value Range   Troponin I <0.30  <0.30 ng/mL  LIPID PANEL     Status: Abnormal   Collection Time   05/17/12 11:46 PM      Component Value Range   Cholesterol 221 (*) 0 - 200 mg/dL   Triglycerides 84  <161 mg/dL   HDL 70  >09 mg/dL   Total CHOL/HDL Ratio 3.2     VLDL 17  0 - 40 mg/dL   LDL Cholesterol 604 (*) 0 - 99 mg/dL  BASIC METABOLIC PANEL     Status: Abnormal   Collection Time   05/18/12  5:30 AM      Component Value Range   Sodium 131 (*) 135 - 145 mEq/L   Potassium 3.7  3.5 - 5.1 mEq/L   Chloride 95 (*) 96 - 112 mEq/L   CO2 27  19 - 32 mEq/L   Glucose, Bld 96  70 - 99 mg/dL   BUN 8  6 - 23 mg/dL   Creatinine, Ser 5.40  0.50 - 1.10 mg/dL   Calcium 9.4  8.4 - 98.1 mg/dL   GFR calc non Af Amer >90  >90 mL/min   GFR calc Af Amer >90  >90 mL/min    Studies/Results: @RISRSLT24 @  Medications: REviewed  1.  CP   Patient r/o for MI  Hx of tobacco use.  Lexiscan myoview with positive EKG changes  Small fixed defect inferiorly.  COncern with combination  for possible balanced ischemia.  Transferred to [facility] for L heart cath Plan today.  2.  HL  LDL is a little high  Await cath findings  Probable statin Rx.  3.  Electrolytes.  K low  Repleted.  Na a little low  Will follow.  Patient Active Hospital Problem List: No active hospital problems.   LOS: 1 day   Dietrich Pates 05/18/2012, 8:17 AM

## 2012-06-11 ENCOUNTER — Ambulatory Visit (INDEPENDENT_AMBULATORY_CARE_PROVIDER_SITE_OTHER): Payer: Medicare Other | Admitting: Ophthalmology

## 2012-06-11 DIAGNOSIS — H43819 Vitreous degeneration, unspecified eye: Secondary | ICD-10-CM

## 2012-06-11 DIAGNOSIS — H3530 Unspecified macular degeneration: Secondary | ICD-10-CM

## 2012-12-10 ENCOUNTER — Ambulatory Visit (INDEPENDENT_AMBULATORY_CARE_PROVIDER_SITE_OTHER): Payer: Medicare Other | Admitting: Ophthalmology

## 2012-12-10 DIAGNOSIS — H3530 Unspecified macular degeneration: Secondary | ICD-10-CM

## 2012-12-10 DIAGNOSIS — H43819 Vitreous degeneration, unspecified eye: Secondary | ICD-10-CM

## 2013-01-03 ENCOUNTER — Encounter (HOSPITAL_BASED_OUTPATIENT_CLINIC_OR_DEPARTMENT_OTHER): Payer: Self-pay | Admitting: *Deleted

## 2013-01-03 NOTE — H&P (Signed)
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 Discussed  For the past 10 days or so, she has a sore in the left lower mandibular alveolus behind her last tooth. She has another one up around the upper premolar. On exam, there is erythema and superficial erosion in both of these areas. The rest of the oral cavity is clear. There is a firm nodularity along the left lower mandibular lesion. Recommend biopsy and laser ablation of both areas under anesthesia. Reason For Visit  Sore in mouth. Allergies  Erythromycin Base TABS; Rash Erythromycin Derivatives; Rash Sulfa Drugs; Other. Current Meds  Advair Diskus MISC;; RPT ProAir HFA AERS;; RPT Claritin TABS;; RPT Pepcid AC TABS;; RPT. 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 PSH  Cataract Surgery Dilation And Curettage Dilation And Curettage. Family Hx  Acute Myocardial Infarction: Father (V17.3) Acute Myocardial Infarction: Brother (V17.3) Asthma: Father (V17.5) Cardiomyopathy Emotional Problems / Concerns: Sister Family history of diabetes mellitus: Brother (V18.0) (Z83.3) Family history of osteoporosis (V17.81) (Z82.62) Hypertension: Sister (V17.49) Mental Disorder Of Unknown (Axis III) Etiology: Sister. Personal Hx  Caffeine  Use Former smoker 1999 [phone]) [phone]) Never Drank Alcohol. ROS  Systemic: Not feeling tired (fatigue).  No fever, no night sweats, and no recent weight loss. Head: No headache. Eyes: No eye symptoms. Otolaryngeal: No hearing loss, no earache, no tinnitus, and no purulent nasal discharge.  No nasal passage blockage (stuffiness), no snoring, no sneezing, no hoarseness, and no sore throat. Cardiovascular: No chest pain or discomfort  and no palpitations. Pulmonary: No dyspnea, no cough, and no wheezing. Gastrointestinal: No dysphagia  and no heartburn.  No nausea, no abdominal pain, and no melena.  No diarrhea. Genitourinary: No dysuria. Endocrine: No muscle weakness. Musculoskeletal: No calf muscle cramps, no arthralgias, and no soft tissue swelling. Neurological: No dizziness, no fainting, no tingling, and no numbness. Psychological: No anxiety  and no depression. Skin: No rash. Signature  Electronically signed by : Serena Colonel  M.D.; 12/31/2012 3:30 PM EST.

## 2013-01-03 NOTE — Progress Notes (Signed)
Pt had chest pain 10/13-cath done-was normal-no fu needed

## 2013-01-09 ENCOUNTER — Ambulatory Visit (HOSPITAL_BASED_OUTPATIENT_CLINIC_OR_DEPARTMENT_OTHER)
Admission: RE | Admit: 2013-01-09 | Discharge: 2013-01-09 | Disposition: A | Discharge status: Home / Self Care | Payer: Medicare Other | Source: Ambulatory Visit | Attending: Otolaryngology | Admitting: Otolaryngology

## 2013-01-09 ENCOUNTER — Encounter (HOSPITAL_BASED_OUTPATIENT_CLINIC_OR_DEPARTMENT_OTHER): Payer: Self-pay | Admitting: Anesthesiology

## 2013-01-09 ENCOUNTER — Encounter (HOSPITAL_BASED_OUTPATIENT_CLINIC_OR_DEPARTMENT_OTHER)
Admission: RE | Disposition: A | Discharge status: Home / Self Care | Payer: Self-pay | Source: Ambulatory Visit | Attending: Otolaryngology

## 2013-01-09 ENCOUNTER — Encounter (HOSPITAL_BASED_OUTPATIENT_CLINIC_OR_DEPARTMENT_OTHER): Payer: Self-pay | Admitting: *Deleted

## 2013-01-09 ENCOUNTER — Ambulatory Visit (HOSPITAL_BASED_OUTPATIENT_CLINIC_OR_DEPARTMENT_OTHER): Payer: Medicare Other | Admitting: Anesthesiology

## 2013-01-09 DIAGNOSIS — Z79899 Other long term (current) drug therapy: Secondary | ICD-10-CM | POA: Insufficient documentation

## 2013-01-09 DIAGNOSIS — M810 Age-related osteoporosis without current pathological fracture: Secondary | ICD-10-CM | POA: Insufficient documentation

## 2013-01-09 DIAGNOSIS — K1329 Other disturbances of oral epithelium, including tongue: Secondary | ICD-10-CM | POA: Insufficient documentation

## 2013-01-09 DIAGNOSIS — C051 Malignant neoplasm of soft palate: Secondary | ICD-10-CM | POA: Insufficient documentation

## 2013-01-09 DIAGNOSIS — K219 Gastro-esophageal reflux disease without esophagitis: Secondary | ICD-10-CM | POA: Insufficient documentation

## 2013-01-09 DIAGNOSIS — Z882 Allergy status to sulfonamides status: Secondary | ICD-10-CM | POA: Insufficient documentation

## 2013-01-09 DIAGNOSIS — L439 Lichen planus, unspecified: Secondary | ICD-10-CM | POA: Insufficient documentation

## 2013-01-09 DIAGNOSIS — K1321 Leukoplakia of oral mucosa, including tongue: Secondary | ICD-10-CM

## 2013-01-09 DIAGNOSIS — Z881 Allergy status to other antibiotic agents status: Secondary | ICD-10-CM | POA: Insufficient documentation

## 2013-01-09 DIAGNOSIS — Z87891 Personal history of nicotine dependence: Secondary | ICD-10-CM | POA: Insufficient documentation

## 2013-01-09 DIAGNOSIS — C060 Malignant neoplasm of cheek mucosa: Secondary | ICD-10-CM | POA: Insufficient documentation

## 2013-01-09 DIAGNOSIS — J45909 Unspecified asthma, uncomplicated: Secondary | ICD-10-CM | POA: Insufficient documentation

## 2013-01-09 DIAGNOSIS — IMO0002 Reserved for concepts with insufficient information to code with codable children: Secondary | ICD-10-CM | POA: Insufficient documentation

## 2013-01-09 DIAGNOSIS — K1370 Unspecified lesions of oral mucosa: Secondary | ICD-10-CM | POA: Insufficient documentation

## 2013-01-09 DIAGNOSIS — G43909 Migraine, unspecified, not intractable, without status migrainosus: Secondary | ICD-10-CM | POA: Insufficient documentation

## 2013-01-09 HISTORY — DX: Unspecified asthma, uncomplicated: J45.909

## 2013-01-09 HISTORY — DX: Presence of spectacles and contact lenses: Z97.3

## 2013-01-09 HISTORY — PX: EXCISION OF TONGUE LESION WITH LASER: SHX5823

## 2013-01-09 LAB — POCT HEMOGLOBIN-HEMACUE: Hemoglobin: 13.7 g/dL (ref 12.0–15.0)

## 2013-01-09 SURGERY — EXCISION, LESION, TONGUE, USING LASER
Anesthesia: General | Site: Mouth | Wound class: Clean Contaminated

## 2013-01-09 MED ORDER — FENTANYL CITRATE 0.05 MG/ML IJ SOLN
INTRAMUSCULAR | Status: DC | PRN
Start: 2013-01-09 — End: 2013-01-09
  Administered 2013-01-09 (×2): 50 ug via INTRAVENOUS

## 2013-01-09 MED ORDER — SUCCINYLCHOLINE CHLORIDE 20 MG/ML IJ SOLN
INTRAMUSCULAR | Status: DC | PRN
  Administered 2013-01-09: 80 mg via INTRAVENOUS

## 2013-01-09 MED ORDER — HYDROCODONE-ACETAMINOPHEN 7.5-325 MG PO TABS: 1.0000 | ORAL_TABLET | Freq: Four times a day (QID) | ORAL | Status: DC | PRN

## 2013-01-09 MED ORDER — LIDOCAINE-EPINEPHRINE 1 %-1:100000 IJ SOLN
INTRAMUSCULAR | Status: DC | PRN
  Administered 2013-01-09: 3 mL

## 2013-01-09 MED ORDER — CEFAZOLIN SODIUM-DEXTROSE 2-3 GM-% IV SOLR
2.0000 g | INTRAVENOUS | Status: AC
  Administered 2013-01-09: 2 g via INTRAVENOUS

## 2013-01-09 MED ORDER — CEPHALEXIN 500 MG PO CAPS: 500.0000 mg | ORAL_CAPSULE | Freq: Three times a day (TID) | ORAL | Status: DC

## 2013-01-09 MED ORDER — LIDOCAINE HCL (CARDIAC) 20 MG/ML IV SOLN
INTRAVENOUS | Status: DC | PRN
  Administered 2013-01-09: 50 mg via INTRAVENOUS

## 2013-01-09 MED ORDER — PROPOFOL 10 MG/ML IV BOLUS
INTRAVENOUS | Status: DC | PRN
  Administered 2013-01-09: 100 mg via INTRAVENOUS

## 2013-01-09 MED ORDER — LACTATED RINGERS IV SOLN
INTRAVENOUS | Status: DC
  Administered 2013-01-09: 11:00:00 via INTRAVENOUS
  Administered 2013-01-09: 20 mL/h via INTRAVENOUS

## 2013-01-09 MED ORDER — HYDROMORPHONE HCL PF 1 MG/ML IJ SOLN
0.2500 mg | INTRAMUSCULAR | Status: DC | PRN
  Administered 2013-01-09: 0.5 mg via INTRAVENOUS

## 2013-01-09 MED ORDER — PROMETHAZINE HCL 25 MG RE SUPP: 25.0000 mg | Freq: Four times a day (QID) | RECTAL | Status: DC | PRN

## 2013-01-09 SURGICAL SUPPLY — 29 items
CANISTER SUCTION 1200CC (MISCELLANEOUS) ×2 IMPLANT
CLOTH BEACON ORANGE TIMEOUT ST (SAFETY) ×2 IMPLANT
COAGULATOR SUCT SWTCH 10FR 6 (ELECTROSURGICAL) IMPLANT
DEPRESSOR TONGUE BLADE STERILE (MISCELLANEOUS) ×2 IMPLANT
ELECT COATED BLADE 2.86 ST (ELECTRODE) IMPLANT
ELECT REM PT RETURN 9FT ADLT (ELECTROSURGICAL) ×2
ELECTRODE REM PT RTRN 9FT ADLT (ELECTROSURGICAL) IMPLANT
FILTER 7/8 IN (FILTER) ×2 IMPLANT
GAUZE SPONGE 4X4 12PLY STRL LF (GAUZE/BANDAGES/DRESSINGS) IMPLANT
GLOVE ECLIPSE 7.5 STRL STRAW (GLOVE) ×2 IMPLANT
GLOVE SURG SS PI 7.0 STRL IVOR (GLOVE) ×1 IMPLANT
GOWN PREVENTION PLUS XLARGE (GOWN DISPOSABLE) ×1 IMPLANT
MARKER SKIN DUAL TIP RULER LAB (MISCELLANEOUS) IMPLANT
NEEDLE 27GAX1X1/2 (NEEDLE) ×2 IMPLANT
NEEDLE HYPO 30GX1 BEV (NEEDLE) IMPLANT
NS IRRIG 1000ML POUR BTL (IV SOLUTION) ×2 IMPLANT
PACK BASIN DAY SURGERY FS (CUSTOM PROCEDURE TRAY) IMPLANT
PATTIES SURGICAL .5 X3 (DISPOSABLE) IMPLANT
PENCIL FOOT CONTROL (ELECTRODE) IMPLANT
REDUCTION FITTING 1/4 IN (FILTER) IMPLANT
SHEET MEDIUM DRAPE 40X70 STRL (DRAPES) ×2 IMPLANT
SLEEVE SCD COMPRESS KNEE MED (MISCELLANEOUS) IMPLANT
SUT SILK 3 0 PS 1 (SUTURE) IMPLANT
SUT VIC AB 3-0 SH 27 (SUTURE)
SUT VIC AB 3-0 SH 27X BRD (SUTURE) IMPLANT
SYR BULB 3OZ (MISCELLANEOUS) IMPLANT
SYR CONTROL 10ML LL (SYRINGE) IMPLANT
TOWEL OR 17X24 6PK STRL BLUE (TOWEL DISPOSABLE) ×2 IMPLANT
TUBE CONNECTING 20X1/4 (TUBING) ×2 IMPLANT

## 2013-01-09 NOTE — Op Note (Signed)
OPERATIVE REPORT  DATE OF SURGERY: 01/09/2013  PATIENT:  Meredith Stewart,  72 y.o. female  PRE-OPERATIVE DIAGNOSIS:  ORAL LESION  POST-OPERATIVE DIAGNOSIS:  ORAL LESION  PROCEDURE:  Procedure(s): ORAL BIOPSY AND LASER ABLATION  SURGEON:  Susy Frizzle, MD  ASSISTANTS: none  ANESTHESIA:   General   EBL:  2 ml  DRAINS: XXX   LOCAL MEDICATIONS USED:  None  SPECIMEN:  Left mandibular alveolar lesion  COUNTS:  Correct  PROCEDURE DETAILS: The patient was taken to the operating room and placed on the operating table in the supine position. Following induction of general endotracheal anesthesia, the table was turned 90. A sterile head drape was applied. Moist eye pads were applied. Wet towels were applied around the face for laser protection. A rubber bite block was used on the right side to keep the mouth open. Gentle retraction was used to expose the mandibular alveolus posteriorly on the left, as well as the peri-canine mucosa. Both areas were infiltrated with Xylocaine with epinephrine solution. A small biopsy was taken from the alveolar region on the mandible. Biopsy was not done on the upper lesion. CO2 laser was used to ablate the abnormal mucosa. Care was taken to gently suctioned off the superficial layers. Hemostasis was achieved using the laser. There was minimal bleeding. Moist sponges were applied until the end of the case. The patient was then awakened, extubated and transferred to recovery in stable condition.    PATIENT DISPOSITION:  To PACU, stable

## 2013-01-09 NOTE — Transfer of Care (Signed)
Immediate Anesthesia Transfer of Care Note  Patient: Meredith Stewart  Procedure(s) Performed: Procedure(s): ORAL BIOPSY AND LASER ABLATION (N/A)  Patient Location: PACU  Anesthesia Type:General  Level of Consciousness: awake and patient cooperative  Airway & Oxygen Therapy: Patient Spontanous Breathing and aerosol face mask  Post-op Assessment: Report given to PACU RN and Post -op Vital signs reviewed and stable  Post vital signs: Reviewed and stable  Complications: No apparent anesthesia complications

## 2013-01-09 NOTE — Anesthesia Postprocedure Evaluation (Signed)
  Anesthesia Post-op Note  Patient: Meredith Stewart  Procedure(s) Performed: Procedure(s): ORAL BIOPSY AND LASER ABLATION (N/A)  Patient Location: PACU  Anesthesia Type:General  Level of Consciousness: awake, alert  and oriented  Airway and Oxygen Therapy: Patient Spontanous Breathing  Post-op Pain: mild  Post-op Assessment: Post-op Vital signs reviewed, Patient's Cardiovascular Status Stable, Respiratory Function Stable, Patent Airway and No signs of Nausea or vomiting  Post-op Vital Signs: Reviewed and stable  Complications: No apparent anesthesia complications

## 2013-01-09 NOTE — Anesthesia Procedure Notes (Signed)
Date/Time: 01/09/2013 10:48 AM Performed by: Gar Gibbon Pre-anesthesia Checklist: Patient identified, Emergency Drugs available, Suction available and Patient being monitored Patient Re-evaluated:Patient Re-evaluated prior to inductionOxygen Delivery Method: Circle system utilized Preoxygenation: Pre-oxygenation with 100% oxygen Intubation Type: IV induction Ventilation: Mask ventilation without difficulty Laryngoscope Size: Miller and 2 Grade View: Grade II Tube type: 5.5 laser tube cuffs filled w methylene blue. Laser Tube: Laser Tube and Cuffed inflated with minimal occlusive pressure - saline Tube size: 5.5 mm Number of attempts: 1 Placement Confirmation: ETT inserted through vocal cords under direct vision,  positive ETCO2 and breath sounds checked- equal and bilateral Tube secured with: Tape Dental Injury: Teeth and Oropharynx as per pre-operative assessment

## 2013-01-09 NOTE — Anesthesia Preprocedure Evaluation (Signed)
Anesthesia Evaluation  Patient identified by MRN, date of birth, ID band Patient awake    Reviewed: Allergy & Precautions, H&P , NPO status , Patient's Chart, lab work & pertinent test results  Airway Mallampati: II TM Distance: >3 FB Neck ROM: Full    Dental no notable dental hx. (+) Teeth Intact and Dental Advisory Given   Pulmonary asthma ,  breath sounds clear to auscultation  Pulmonary exam normal       Cardiovascular negative cardio ROS  Rhythm:Regular Rate:Normal     Neuro/Psych negative neurological ROS  negative psych ROS   GI/Hepatic negative GI ROS, Neg liver ROS, GERD-  Medicated and Controlled,  Endo/Other  negative endocrine ROS  Renal/GU negative Renal ROS  negative genitourinary   Musculoskeletal   Abdominal   Peds  Hematology negative hematology ROS (+)   Anesthesia Other Findings   Reproductive/Obstetrics negative OB ROS                           Anesthesia Physical Anesthesia Plan  ASA: II  Anesthesia Plan: General   Post-op Pain Management:    Induction: Intravenous  Airway Management Planned: Oral ETT  Additional Equipment:   Intra-op Plan:   Post-operative Plan: Extubation in OR  Informed Consent: I have reviewed the patients History and Physical, chart, labs and discussed the procedure including the risks, benefits and alternatives for the proposed anesthesia with the patient or authorized representative who has indicated his/her understanding and acceptance.   Dental advisory given  Plan Discussed with: CRNA  Anesthesia Plan Comments:         Anesthesia Quick Evaluation

## 2013-01-09 NOTE — Interval H&P Note (Signed)
History and Physical Interval Note:  01/09/2013 10:23 AM  Meredith Stewart  has presented today for surgery, with the diagnosis of ORAL LESION  The various methods of treatment have been discussed with the patient and family. After consideration of risks, benefits and other options for treatment, the patient has consented to  Procedure(s): ORAL BIOPSY AND LASER ABLATION (N/A) as a surgical intervention .  The patient's history has been reviewed, patient examined, no change in status, stable for surgery.  I have reviewed the patient's chart and labs.  Questions were answered to the patient's satisfaction.     Clovis Warwick

## 2013-01-10 ENCOUNTER — Encounter (HOSPITAL_BASED_OUTPATIENT_CLINIC_OR_DEPARTMENT_OTHER): Payer: Self-pay | Admitting: Otolaryngology

## 2013-06-12 ENCOUNTER — Ambulatory Visit (INDEPENDENT_AMBULATORY_CARE_PROVIDER_SITE_OTHER): Payer: Medicare Other | Admitting: Ophthalmology

## 2013-06-12 DIAGNOSIS — H43819 Vitreous degeneration, unspecified eye: Secondary | ICD-10-CM

## 2013-06-12 DIAGNOSIS — H3530 Unspecified macular degeneration: Secondary | ICD-10-CM

## 2013-08-01 ENCOUNTER — Telehealth: Payer: Self-pay | Admitting: *Deleted

## 2013-08-01 NOTE — Telephone Encounter (Signed)
done

## 2013-12-10 ENCOUNTER — Ambulatory Visit (INDEPENDENT_AMBULATORY_CARE_PROVIDER_SITE_OTHER): Payer: Medicare Other | Admitting: Ophthalmology

## 2013-12-10 DIAGNOSIS — H3530 Unspecified macular degeneration: Secondary | ICD-10-CM

## 2013-12-10 DIAGNOSIS — H43819 Vitreous degeneration, unspecified eye: Secondary | ICD-10-CM

## 2014-01-15 ENCOUNTER — Encounter (INDEPENDENT_AMBULATORY_CARE_PROVIDER_SITE_OTHER): Payer: Medicare Other | Admitting: Ophthalmology

## 2014-01-15 DIAGNOSIS — H43819 Vitreous degeneration, unspecified eye: Secondary | ICD-10-CM

## 2014-01-15 DIAGNOSIS — H3530 Unspecified macular degeneration: Secondary | ICD-10-CM

## 2014-03-17 ENCOUNTER — Encounter (INDEPENDENT_AMBULATORY_CARE_PROVIDER_SITE_OTHER): Payer: Medicare Other | Admitting: Ophthalmology

## 2014-03-17 DIAGNOSIS — H43819 Vitreous degeneration, unspecified eye: Secondary | ICD-10-CM

## 2014-03-17 DIAGNOSIS — H3530 Unspecified macular degeneration: Secondary | ICD-10-CM

## 2014-07-03 ENCOUNTER — Encounter (HOSPITAL_COMMUNITY): Payer: Self-pay | Admitting: Cardiovascular Disease

## 2014-09-15 ENCOUNTER — Ambulatory Visit (INDEPENDENT_AMBULATORY_CARE_PROVIDER_SITE_OTHER): Payer: Medicare Other | Admitting: Ophthalmology

## 2014-10-29 ENCOUNTER — Other Ambulatory Visit: Payer: Self-pay | Admitting: Otolaryngology

## 2014-12-16 ENCOUNTER — Ambulatory Visit (INDEPENDENT_AMBULATORY_CARE_PROVIDER_SITE_OTHER): Payer: Medicare Other | Admitting: Ophthalmology

## 2014-12-16 DIAGNOSIS — H3531 Nonexudative age-related macular degeneration: Secondary | ICD-10-CM | POA: Diagnosis not present

## 2014-12-16 DIAGNOSIS — H43813 Vitreous degeneration, bilateral: Secondary | ICD-10-CM | POA: Diagnosis not present

## 2015-11-03 DIAGNOSIS — K1370 Unspecified lesions of oral mucosa: Secondary | ICD-10-CM | POA: Diagnosis not present

## 2015-11-04 ENCOUNTER — Encounter (HOSPITAL_BASED_OUTPATIENT_CLINIC_OR_DEPARTMENT_OTHER): Payer: Self-pay | Admitting: *Deleted

## 2015-11-05 ENCOUNTER — Ambulatory Visit: Payer: Self-pay | Admitting: Otolaryngology

## 2015-11-05 NOTE — H&P (Signed)
  Progress Notes    She is here for examination because her dentist noticed something abnormal in the roof of her mouth yesterday. She herself had noticed this for a few months but was reluctant to come in. On examination, there are are 2 adjacent areas on the right of midline hard palate mucosa with a raised irregular firm edges. There are no other oral lesions and there is no adenopathy palpable.  This is consistent with either papilloma or carcinoma. Recommend biopsy under anesthesia with laser excision. She is agreeable. We will schedule at her convenience.

## 2015-11-09 ENCOUNTER — Ambulatory Visit (HOSPITAL_BASED_OUTPATIENT_CLINIC_OR_DEPARTMENT_OTHER): Payer: Medicare Other | Admitting: Anesthesiology

## 2015-11-09 ENCOUNTER — Ambulatory Visit (HOSPITAL_BASED_OUTPATIENT_CLINIC_OR_DEPARTMENT_OTHER)
Admission: RE | Admit: 2015-11-09 | Discharge: 2015-11-09 | Disposition: A | Discharge status: Home / Self Care | Payer: Medicare Other | Source: Ambulatory Visit | Attending: Otolaryngology | Admitting: Otolaryngology

## 2015-11-09 ENCOUNTER — Encounter (HOSPITAL_BASED_OUTPATIENT_CLINIC_OR_DEPARTMENT_OTHER): Payer: Self-pay | Admitting: Anesthesiology

## 2015-11-09 ENCOUNTER — Encounter (HOSPITAL_BASED_OUTPATIENT_CLINIC_OR_DEPARTMENT_OTHER)
Admission: RE | Disposition: A | Discharge status: Home / Self Care | Payer: Self-pay | Source: Ambulatory Visit | Attending: Otolaryngology

## 2015-11-09 DIAGNOSIS — R220 Localized swelling, mass and lump, head: Secondary | ICD-10-CM | POA: Diagnosis not present

## 2015-11-09 DIAGNOSIS — D1039 Benign neoplasm of other parts of mouth: Secondary | ICD-10-CM | POA: Insufficient documentation

## 2015-11-09 DIAGNOSIS — J45909 Unspecified asthma, uncomplicated: Secondary | ICD-10-CM | POA: Diagnosis not present

## 2015-11-09 DIAGNOSIS — K219 Gastro-esophageal reflux disease without esophagitis: Secondary | ICD-10-CM | POA: Insufficient documentation

## 2015-11-09 DIAGNOSIS — Z87891 Personal history of nicotine dependence: Secondary | ICD-10-CM | POA: Diagnosis not present

## 2015-11-09 DIAGNOSIS — K1370 Unspecified lesions of oral mucosa: Secondary | ICD-10-CM | POA: Diagnosis not present

## 2015-11-09 HISTORY — PX: EXCISION ORAL LESION WITH CO2 LASER: SHX6461

## 2015-11-09 HISTORY — DX: Nonrheumatic mitral (valve) prolapse: I34.1

## 2015-11-09 SURGERY — DESTRUCTION, LESION, MOUTH, USING CO2 LASER
Anesthesia: General | Site: Mouth | Laterality: Right

## 2015-11-09 MED ORDER — MIDAZOLAM HCL 2 MG/2ML IJ SOLN: 1.0000 mg | INTRAMUSCULAR | Status: DC | PRN

## 2015-11-09 MED ORDER — ONDANSETRON HCL 4 MG/2ML IJ SOLN
INTRAMUSCULAR | Status: DC | PRN
  Administered 2015-11-09: 4 mg via INTRAVENOUS

## 2015-11-09 MED ORDER — FENTANYL CITRATE (PF) 100 MCG/2ML IJ SOLN
50.0000 ug | INTRAMUSCULAR | Status: DC | PRN
  Administered 2015-11-09 (×2): 50 ug via INTRAVENOUS

## 2015-11-09 MED ORDER — ONDANSETRON HCL 4 MG/2ML IJ SOLN
INTRAMUSCULAR | Status: AC
  Filled 2015-11-09: qty 2

## 2015-11-09 MED ORDER — PROPOFOL 500 MG/50ML IV EMUL
INTRAVENOUS | Status: AC
  Filled 2015-11-09: qty 50

## 2015-11-09 MED ORDER — DEXAMETHASONE SODIUM PHOSPHATE 4 MG/ML IJ SOLN
INTRAMUSCULAR | Status: DC | PRN
  Administered 2015-11-09: 10 mg via INTRAVENOUS

## 2015-11-09 MED ORDER — PROPOFOL 10 MG/ML IV BOLUS
INTRAVENOUS | Status: DC | PRN
Start: 2015-11-09 — End: 2015-11-09
  Administered 2015-11-09: 50 mg via INTRAVENOUS
  Administered 2015-11-09: 150 mg via INTRAVENOUS

## 2015-11-09 MED ORDER — GLYCOPYRROLATE 0.2 MG/ML IJ SOLN: 0.2000 mg | Freq: Once | INTRAMUSCULAR | Status: DC | PRN

## 2015-11-09 MED ORDER — SCOPOLAMINE 1 MG/3DAYS TD PT72: 1.0000 | MEDICATED_PATCH | Freq: Once | TRANSDERMAL | Status: DC | PRN

## 2015-11-09 MED ORDER — MEPERIDINE HCL 25 MG/ML IJ SOLN: 6.2500 mg | INTRAMUSCULAR | Status: DC | PRN

## 2015-11-09 MED ORDER — LIDOCAINE HCL (CARDIAC) 20 MG/ML IV SOLN
INTRAVENOUS | Status: AC
  Filled 2015-11-09: qty 5

## 2015-11-09 MED ORDER — EPHEDRINE SULFATE 50 MG/ML IJ SOLN
INTRAMUSCULAR | Status: DC | PRN
  Administered 2015-11-09: 10 mg via INTRAVENOUS

## 2015-11-09 MED ORDER — DEXAMETHASONE SODIUM PHOSPHATE 10 MG/ML IJ SOLN
INTRAMUSCULAR | Status: AC
  Filled 2015-11-09: qty 1

## 2015-11-09 MED ORDER — LIDOCAINE HCL (CARDIAC) 20 MG/ML IV SOLN
INTRAVENOUS | Status: DC | PRN
  Administered 2015-11-09: 80 mg via INTRAVENOUS

## 2015-11-09 MED ORDER — LIDOCAINE-EPINEPHRINE 1 %-1:100000 IJ SOLN
INTRAMUSCULAR | Status: DC | PRN
  Administered 2015-11-09: 1 mL

## 2015-11-09 MED ORDER — FENTANYL CITRATE (PF) 100 MCG/2ML IJ SOLN
INTRAMUSCULAR | Status: AC
  Filled 2015-11-09: qty 2

## 2015-11-09 MED ORDER — FENTANYL CITRATE (PF) 100 MCG/2ML IJ SOLN
25.0000 ug | INTRAMUSCULAR | Status: DC | PRN
  Administered 2015-11-09: 25 ug via INTRAVENOUS

## 2015-11-09 MED ORDER — SILVER NITRATE-POT NITRATE 75-25 % EX MISC
CUTANEOUS | Status: AC
  Filled 2015-11-09: qty 1

## 2015-11-09 MED ORDER — HYDROCODONE-ACETAMINOPHEN 7.5-325 MG/15ML PO SOLN: 15.0000 mL | Freq: Four times a day (QID) | ORAL | Status: DC | PRN

## 2015-11-09 MED ORDER — CIPROFLOXACIN-DEXAMETHASONE 0.3-0.1 % OT SUSP
OTIC | Status: AC
  Filled 2015-11-09: qty 15

## 2015-11-09 MED ORDER — SUCCINYLCHOLINE CHLORIDE 20 MG/ML IJ SOLN
INTRAMUSCULAR | Status: DC | PRN
  Administered 2015-11-09: 100 mg via INTRAVENOUS

## 2015-11-09 MED ORDER — LACTATED RINGERS IV SOLN
INTRAVENOUS | Status: DC
  Administered 2015-11-09 (×2): via INTRAVENOUS

## 2015-11-09 MED ORDER — METOCLOPRAMIDE HCL 5 MG/ML IJ SOLN: 10.0000 mg | Freq: Once | INTRAMUSCULAR | Status: DC | PRN

## 2015-11-09 SURGICAL SUPPLY — 37 items
BANDAGE EYE OVAL (MISCELLANEOUS) ×6 IMPLANT
CANISTER SUCT 1200ML W/VALVE (MISCELLANEOUS) ×3 IMPLANT
COAGULATOR SUCT 6 FR SWTCH (ELECTROSURGICAL)
COAGULATOR SUCT SWTCH 10FR 6 (ELECTROSURGICAL) IMPLANT
COVER MAYO STAND STRL (DRAPES) ×3 IMPLANT
DEPRESSOR TONGUE BLADE STERILE (MISCELLANEOUS) ×3 IMPLANT
ELECT COATED BLADE 2.86 ST (ELECTRODE) ×4 IMPLANT
ELECT REM PT RETURN 9FT ADLT (ELECTROSURGICAL) ×3
ELECTRODE REM PT RTRN 9FT ADLT (ELECTROSURGICAL) ×1 IMPLANT
FILTER 7/8 IN (FILTER) ×3 IMPLANT
GLOVE BIOGEL PI IND STRL 7.0 (GLOVE) IMPLANT
GLOVE BIOGEL PI INDICATOR 7.0 (GLOVE) ×2
GLOVE ECLIPSE 6.5 STRL STRAW (GLOVE) ×2 IMPLANT
GLOVE ECLIPSE 7.5 STRL STRAW (GLOVE) ×3 IMPLANT
GOWN STRL REUS W/ TWL LRG LVL3 (GOWN DISPOSABLE) IMPLANT
GOWN STRL REUS W/TWL LRG LVL3 (GOWN DISPOSABLE)
MARKER SKIN DUAL TIP RULER LAB (MISCELLANEOUS) IMPLANT
NDL PRECISIONGLIDE 27X1.5 (NEEDLE) ×1 IMPLANT
NEEDLE HYPO 30GX1 BEV (NEEDLE) IMPLANT
NEEDLE PRECISIONGLIDE 27X1.5 (NEEDLE) ×3 IMPLANT
NS IRRIG 1000ML POUR BTL (IV SOLUTION) ×3 IMPLANT
PACK BASIN DAY SURGERY FS (CUSTOM PROCEDURE TRAY) ×2 IMPLANT
PATTIES SURGICAL .5 X3 (DISPOSABLE) IMPLANT
PENCIL FOOT CONTROL (ELECTRODE) ×2 IMPLANT
REDUCTION FITTING 1/4 IN (FILTER) ×2 IMPLANT
SHEET MEDIUM DRAPE 40X70 STRL (DRAPES) ×3 IMPLANT
SLEEVE SCD COMPRESS KNEE MED (MISCELLANEOUS) ×2 IMPLANT
SPONGE GAUZE 4X4 12PLY STER LF (GAUZE/BANDAGES/DRESSINGS) IMPLANT
SUT CHROMIC 4 0 P 3 18 (SUTURE) ×2 IMPLANT
SUT SILK 3 0 PS 1 (SUTURE) IMPLANT
SUT VIC AB 3-0 SH 27 (SUTURE)
SUT VIC AB 3-0 SH 27X BRD (SUTURE) IMPLANT
SYR BULB 3OZ (MISCELLANEOUS) ×2 IMPLANT
SYR CONTROL 10ML LL (SYRINGE) ×2 IMPLANT
TOWEL OR 17X24 6PK STRL BLUE (TOWEL DISPOSABLE) ×3 IMPLANT
TUBE CONNECTING 20'X1/4 (TUBING) ×2
TUBE CONNECTING 20X1/4 (TUBING) ×3 IMPLANT

## 2015-11-09 NOTE — Transfer of Care (Signed)
Immediate Anesthesia Transfer of Care Note  Patient: Meredith Stewart  Procedure(s) Performed: Procedure(s): EXCISION ORAL LESION WITH CO2 LASER AND FROZEN SECTION  (N/A)  Patient Location: PACU  Anesthesia Type:General  Level of Consciousness: awake, alert  and oriented  Airway & Oxygen Therapy: Patient Spontanous Breathing and Patient connected to face mask oxygen  Post-op Assessment: Report given to RN and Post -op Vital signs reviewed and stable  Post vital signs: Reviewed and stable  Last Vitals:  Filed Vitals:   11/09/15 0822  BP: 157/74  Pulse: 88  Temp: 36.6 C  Resp: 18    Complications: No apparent anesthesia complications

## 2015-11-09 NOTE — Discharge Instructions (Signed)
Rinse mouth with saltwater 2-3 times daily.   Post Anesthesia Home Care Instructions  Activity: Get plenty of rest for the remainder of the day. A responsible adult should stay with you for 24 hours following the procedure.  For the next 24 hours, DO NOT: -Drive a car -Paediatric nurse -Drink alcoholic beverages -Take any medication unless instructed by your physician -Make any legal decisions or sign important papers.  Meals: Start with liquid foods such as gelatin or soup. Progress to regular foods as tolerated. Avoid greasy, spicy, heavy foods. If nausea and/or vomiting occur, drink only clear liquids until the nausea and/or vomiting subsides. Call your physician if vomiting continues.  Special Instructions/Symptoms: Your throat may feel dry or sore from the anesthesia or the breathing tube placed in your throat during surgery. If this causes discomfort, gargle with warm salt water. The discomfort should disappear within 24 hours.  If you had a scopolamine patch placed behind your ear for the management of post- operative nausea and/or vomiting:  1. The medication in the patch is effective for 72 hours, after which it should be removed.  Wrap patch in a tissue and discard in the trash. Wash hands thoroughly with soap and water. 2. You may remove the patch earlier than 72 hours if you experience unpleasant side effects which may include dry mouth, dizziness or visual disturbances. 3. Avoid touching the patch. Wash your hands with soap and water after contact with the patch.

## 2015-11-09 NOTE — Anesthesia Procedure Notes (Signed)
Procedure Name: Intubation Date/Time: 11/09/2015 9:52 AM Performed by: Maryella Shivers Pre-anesthesia Checklist: Patient identified, Emergency Drugs available, Suction available and Patient being monitored Patient Re-evaluated:Patient Re-evaluated prior to inductionOxygen Delivery Method: Circle System Utilized Preoxygenation: Pre-oxygenation with 100% oxygen Intubation Type: IV induction Ventilation: Mask ventilation without difficulty Laryngoscope Size: Mac and 3 Tube type: Oral Laser Tube: Laser Tube and Cuffed inflated with minimal occlusive pressure - saline Tube size: 6.0 mm Number of attempts: 1 Airway Equipment and Method: Stylet and Oral airway Placement Confirmation: ETT inserted through vocal cords under direct vision,  positive ETCO2 and breath sounds checked- equal and bilateral Tube secured with: Tape Dental Injury: Teeth and Oropharynx as per pre-operative assessment

## 2015-11-09 NOTE — Op Note (Signed)
OPERATIVE REPORT  DATE OF SURGERY: 11/09/2015  PATIENT:  Meredith Stewart,  75 y.o. female  PRE-OPERATIVE DIAGNOSIS:  ORAL LESIONS   POST-OPERATIVE DIAGNOSIS:  * No post-op diagnosis entered *  PROCEDURE:  Procedure(s): EXCISION ORAL LESION WITH CO2 LASER AND FROZEN SECTION   SURGEON:  Beckie Salts, MD  ASSISTANTS: None  ANESTHESIA:   General   EBL:  10 ml  DRAINS: None  LOCAL MEDICATIONS USED:  1% Xylocaine with epinephrine  SPECIMEN:  Right side hard palate mucosal biopsy  COUNTS:  Correct  PROCEDURE DETAILS: The patient was taken to the operating room and placed on the operating table in the supine position. Following induction of general endotracheal anesthesia, the oral cavity was inspected. The hard palate mass was identified. Local anesthetic was infiltrated around the mass. A #15 scalpel was used to excise a portion of the lesion and this was sent for pathologic evaluation. The CO2 laser was then used on a setting of 4 W continuous power to ablate any abnormal appearing mucosa including the mass that was identified. A total area of approximately 2.5 x 1.5 cm was treated. No other lesions were identified. He was used for hemostasis completion. The oral cavity was irrigated with saline and suctioned. The patient was awakened extubated and transferred to recovery in stable condition.    PATIENT DISPOSITION:  To PACU, stable

## 2015-11-09 NOTE — Anesthesia Postprocedure Evaluation (Signed)
Anesthesia Post Note  Patient: BAILEYANN DENGLER  Procedure(s) Performed: Procedure(s) (LRB): EXCISION ORAL LESION WITH CO2 LASER AND FROZEN SECTION  (Right)  Patient location during evaluation: PACU Anesthesia Type: General Level of consciousness: awake and alert Pain management: pain level controlled Vital Signs Assessment: post-procedure vital signs reviewed and stable Respiratory status: spontaneous breathing, nonlabored ventilation, respiratory function stable and patient connected to nasal cannula oxygen Cardiovascular status: blood pressure returned to baseline and stable Postop Assessment: no signs of nausea or vomiting Anesthetic complications: no    Last Vitals:  Filed Vitals:   11/09/15 1100 11/09/15 1115  BP: 151/84 143/80  Pulse: 83 79  Temp:    Resp: 19 12    Last Pain:  Filed Vitals:   11/09/15 1116  PainSc: 2                  Montez Hageman

## 2015-11-09 NOTE — Anesthesia Preprocedure Evaluation (Addendum)
Anesthesia Evaluation  Patient identified by MRN, date of birth, ID band Patient awake    Reviewed: Allergy & Precautions, NPO status , Patient's Chart, lab work & pertinent test results  Airway Mallampati: II  TM Distance: >3 FB Neck ROM: Full    Dental no notable dental hx.    Pulmonary asthma , former smoker,    Pulmonary exam normal breath sounds clear to auscultation       Cardiovascular Normal cardiovascular exam+ Valvular Problems/Murmurs MVP  Rhythm:Regular Rate:Normal     Neuro/Psych negative neurological ROS  negative psych ROS   GI/Hepatic negative GI ROS, Neg liver ROS, GERD  Medicated and Controlled,  Endo/Other  negative endocrine ROS  Renal/GU negative Renal ROS  negative genitourinary   Musculoskeletal negative musculoskeletal ROS (+)   Abdominal   Peds negative pediatric ROS (+)  Hematology negative hematology ROS (+)   Anesthesia Other Findings   Reproductive/Obstetrics negative OB ROS                            Anesthesia Physical Anesthesia Plan  ASA: II  Anesthesia Plan: General   Post-op Pain Management:    Induction: Intravenous  Airway Management Planned: Oral ETT  Additional Equipment:   Intra-op Plan:   Post-operative Plan: Extubation in OR  Informed Consent: I have reviewed the patients History and Physical, chart, labs and discussed the procedure including the risks, benefits and alternatives for the proposed anesthesia with the patient or authorized representative who has indicated his/her understanding and acceptance.   Dental advisory given  Plan Discussed with: CRNA  Anesthesia Plan Comments:         Anesthesia Quick Evaluation

## 2015-11-09 NOTE — H&P (View-Only) (Signed)
  Progress Notes    She is here for examination because her dentist noticed something abnormal in the roof of her mouth yesterday. She herself had noticed this for a few months but was reluctant to come in. On examination, there are are 2 adjacent areas on the right of midline hard palate mucosa with a raised irregular firm edges. There are no other oral lesions and there is no adenopathy palpable.  This is consistent with either papilloma or carcinoma. Recommend biopsy under anesthesia with laser excision. She is agreeable. We will schedule at her convenience.

## 2015-11-09 NOTE — Interval H&P Note (Signed)
History and Physical Interval Note:  11/09/2015 9:27 AM  Meredith Stewart  has presented today for surgery, with the diagnosis of ORAL LESIONS   The various methods of treatment have been discussed with the patient and family. After consideration of risks, benefits and other options for treatment, the patient has consented to  Procedure(s): EXCISION ORAL LESION WITH CO2 LASER AND FROZEN SECTION  (N/A) as a surgical intervention .  The patient's history has been reviewed, patient examined, no change in status, stable for surgery.  I have reviewed the patient's chart and labs.  Questions were answered to the patient's satisfaction.     Bettyjo Lundblad

## 2015-11-10 ENCOUNTER — Encounter (HOSPITAL_BASED_OUTPATIENT_CLINIC_OR_DEPARTMENT_OTHER): Payer: Self-pay | Admitting: Otolaryngology

## 2015-12-09 DIAGNOSIS — D509 Iron deficiency anemia, unspecified: Secondary | ICD-10-CM | POA: Diagnosis not present

## 2015-12-09 DIAGNOSIS — Z299 Encounter for prophylactic measures, unspecified: Secondary | ICD-10-CM | POA: Diagnosis not present

## 2015-12-09 DIAGNOSIS — J449 Chronic obstructive pulmonary disease, unspecified: Secondary | ICD-10-CM | POA: Diagnosis not present

## 2015-12-09 DIAGNOSIS — E7800 Pure hypercholesterolemia, unspecified: Secondary | ICD-10-CM | POA: Diagnosis not present

## 2015-12-09 DIAGNOSIS — Z87891 Personal history of nicotine dependence: Secondary | ICD-10-CM | POA: Diagnosis not present

## 2015-12-16 ENCOUNTER — Ambulatory Visit (INDEPENDENT_AMBULATORY_CARE_PROVIDER_SITE_OTHER): Payer: Medicare Other | Admitting: Ophthalmology

## 2015-12-28 ENCOUNTER — Ambulatory Visit (INDEPENDENT_AMBULATORY_CARE_PROVIDER_SITE_OTHER): Payer: Medicare Other | Admitting: Ophthalmology

## 2015-12-28 DIAGNOSIS — H43813 Vitreous degeneration, bilateral: Secondary | ICD-10-CM

## 2015-12-28 DIAGNOSIS — H353134 Nonexudative age-related macular degeneration, bilateral, advanced atrophic with subfoveal involvement: Secondary | ICD-10-CM

## 2016-01-05 DIAGNOSIS — L570 Actinic keratosis: Secondary | ICD-10-CM | POA: Diagnosis not present

## 2016-01-05 DIAGNOSIS — L820 Inflamed seborrheic keratosis: Secondary | ICD-10-CM | POA: Diagnosis not present

## 2016-02-15 DIAGNOSIS — X32XXXD Exposure to sunlight, subsequent encounter: Secondary | ICD-10-CM | POA: Diagnosis not present

## 2016-02-15 DIAGNOSIS — L570 Actinic keratosis: Secondary | ICD-10-CM | POA: Diagnosis not present

## 2016-03-30 DIAGNOSIS — Z23 Encounter for immunization: Secondary | ICD-10-CM | POA: Diagnosis not present

## 2016-04-20 DIAGNOSIS — C44729 Squamous cell carcinoma of skin of left lower limb, including hip: Secondary | ICD-10-CM | POA: Diagnosis not present

## 2016-05-26 DIAGNOSIS — G43909 Migraine, unspecified, not intractable, without status migrainosus: Secondary | ICD-10-CM | POA: Diagnosis not present

## 2016-05-26 DIAGNOSIS — L97822 Non-pressure chronic ulcer of other part of left lower leg with fat layer exposed: Secondary | ICD-10-CM | POA: Diagnosis not present

## 2016-05-26 DIAGNOSIS — L0889 Other specified local infections of the skin and subcutaneous tissue: Secondary | ICD-10-CM | POA: Diagnosis not present

## 2016-05-26 DIAGNOSIS — Z299 Encounter for prophylactic measures, unspecified: Secondary | ICD-10-CM | POA: Diagnosis not present

## 2016-06-09 DIAGNOSIS — L0889 Other specified local infections of the skin and subcutaneous tissue: Secondary | ICD-10-CM | POA: Diagnosis not present

## 2016-06-09 DIAGNOSIS — L0101 Non-bullous impetigo: Secondary | ICD-10-CM | POA: Diagnosis not present

## 2016-06-09 DIAGNOSIS — L98492 Non-pressure chronic ulcer of skin of other sites with fat layer exposed: Secondary | ICD-10-CM | POA: Diagnosis not present

## 2016-06-22 DIAGNOSIS — J449 Chronic obstructive pulmonary disease, unspecified: Secondary | ICD-10-CM | POA: Diagnosis not present

## 2016-06-22 DIAGNOSIS — Z299 Encounter for prophylactic measures, unspecified: Secondary | ICD-10-CM | POA: Diagnosis not present

## 2016-06-22 DIAGNOSIS — Z713 Dietary counseling and surveillance: Secondary | ICD-10-CM | POA: Diagnosis not present

## 2016-06-22 DIAGNOSIS — L97309 Non-pressure chronic ulcer of unspecified ankle with unspecified severity: Secondary | ICD-10-CM | POA: Diagnosis not present

## 2016-06-27 DIAGNOSIS — L97921 Non-pressure chronic ulcer of unspecified part of left lower leg limited to breakdown of skin: Secondary | ICD-10-CM | POA: Diagnosis not present

## 2016-06-27 DIAGNOSIS — L97901 Non-pressure chronic ulcer of unspecified part of unspecified lower leg limited to breakdown of skin: Secondary | ICD-10-CM | POA: Diagnosis not present

## 2016-06-29 DIAGNOSIS — I872 Venous insufficiency (chronic) (peripheral): Secondary | ICD-10-CM | POA: Diagnosis not present

## 2016-06-29 DIAGNOSIS — J449 Chronic obstructive pulmonary disease, unspecified: Secondary | ICD-10-CM | POA: Diagnosis not present

## 2016-06-29 DIAGNOSIS — K219 Gastro-esophageal reflux disease without esophagitis: Secondary | ICD-10-CM | POA: Diagnosis not present

## 2016-06-29 DIAGNOSIS — T8131XA Disruption of external operation (surgical) wound, not elsewhere classified, initial encounter: Secondary | ICD-10-CM | POA: Diagnosis not present

## 2016-06-29 DIAGNOSIS — Z881 Allergy status to other antibiotic agents status: Secondary | ICD-10-CM | POA: Diagnosis not present

## 2016-06-29 DIAGNOSIS — Z79899 Other long term (current) drug therapy: Secondary | ICD-10-CM | POA: Diagnosis not present

## 2016-06-29 DIAGNOSIS — I83028 Varicose veins of left lower extremity with ulcer other part of lower leg: Secondary | ICD-10-CM | POA: Diagnosis not present

## 2016-06-29 DIAGNOSIS — Z882 Allergy status to sulfonamides status: Secondary | ICD-10-CM | POA: Diagnosis not present

## 2016-06-29 DIAGNOSIS — T8131XD Disruption of external operation (surgical) wound, not elsewhere classified, subsequent encounter: Secondary | ICD-10-CM | POA: Diagnosis not present

## 2016-06-29 DIAGNOSIS — E7800 Pure hypercholesterolemia, unspecified: Secondary | ICD-10-CM | POA: Diagnosis not present

## 2016-06-29 DIAGNOSIS — Z87891 Personal history of nicotine dependence: Secondary | ICD-10-CM | POA: Diagnosis not present

## 2016-06-29 DIAGNOSIS — L97828 Non-pressure chronic ulcer of other part of left lower leg with other specified severity: Secondary | ICD-10-CM | POA: Diagnosis not present

## 2016-06-29 DIAGNOSIS — C44729 Squamous cell carcinoma of skin of left lower limb, including hip: Secondary | ICD-10-CM | POA: Diagnosis not present

## 2016-07-04 DIAGNOSIS — J449 Chronic obstructive pulmonary disease, unspecified: Secondary | ICD-10-CM | POA: Diagnosis not present

## 2016-07-04 DIAGNOSIS — Z299 Encounter for prophylactic measures, unspecified: Secondary | ICD-10-CM | POA: Diagnosis not present

## 2016-07-04 DIAGNOSIS — R109 Unspecified abdominal pain: Secondary | ICD-10-CM | POA: Diagnosis not present

## 2016-07-04 DIAGNOSIS — N390 Urinary tract infection, site not specified: Secondary | ICD-10-CM | POA: Diagnosis not present

## 2016-07-04 DIAGNOSIS — Z713 Dietary counseling and surveillance: Secondary | ICD-10-CM | POA: Diagnosis not present

## 2016-07-06 DIAGNOSIS — T8131XD Disruption of external operation (surgical) wound, not elsewhere classified, subsequent encounter: Secondary | ICD-10-CM | POA: Diagnosis not present

## 2016-07-06 DIAGNOSIS — I83028 Varicose veins of left lower extremity with ulcer other part of lower leg: Secondary | ICD-10-CM | POA: Diagnosis not present

## 2016-07-06 DIAGNOSIS — C44729 Squamous cell carcinoma of skin of left lower limb, including hip: Secondary | ICD-10-CM | POA: Diagnosis not present

## 2016-07-06 DIAGNOSIS — E7800 Pure hypercholesterolemia, unspecified: Secondary | ICD-10-CM | POA: Diagnosis not present

## 2016-07-06 DIAGNOSIS — I872 Venous insufficiency (chronic) (peripheral): Secondary | ICD-10-CM | POA: Diagnosis not present

## 2016-07-06 DIAGNOSIS — L97828 Non-pressure chronic ulcer of other part of left lower leg with other specified severity: Secondary | ICD-10-CM | POA: Diagnosis not present

## 2016-07-13 DIAGNOSIS — L97822 Non-pressure chronic ulcer of other part of left lower leg with fat layer exposed: Secondary | ICD-10-CM | POA: Diagnosis not present

## 2016-07-13 DIAGNOSIS — I83028 Varicose veins of left lower extremity with ulcer other part of lower leg: Secondary | ICD-10-CM | POA: Diagnosis not present

## 2016-07-13 DIAGNOSIS — E7800 Pure hypercholesterolemia, unspecified: Secondary | ICD-10-CM | POA: Diagnosis not present

## 2016-07-13 DIAGNOSIS — I872 Venous insufficiency (chronic) (peripheral): Secondary | ICD-10-CM | POA: Diagnosis not present

## 2016-07-13 DIAGNOSIS — C44729 Squamous cell carcinoma of skin of left lower limb, including hip: Secondary | ICD-10-CM | POA: Diagnosis not present

## 2016-07-13 DIAGNOSIS — T8131XD Disruption of external operation (surgical) wound, not elsewhere classified, subsequent encounter: Secondary | ICD-10-CM | POA: Diagnosis not present

## 2016-07-13 DIAGNOSIS — L97828 Non-pressure chronic ulcer of other part of left lower leg with other specified severity: Secondary | ICD-10-CM | POA: Diagnosis not present

## 2016-07-15 DIAGNOSIS — Z79899 Other long term (current) drug therapy: Secondary | ICD-10-CM | POA: Diagnosis not present

## 2016-07-15 DIAGNOSIS — R350 Frequency of micturition: Secondary | ICD-10-CM | POA: Diagnosis not present

## 2016-07-15 DIAGNOSIS — E559 Vitamin D deficiency, unspecified: Secondary | ICD-10-CM | POA: Diagnosis not present

## 2016-07-15 DIAGNOSIS — F329 Major depressive disorder, single episode, unspecified: Secondary | ICD-10-CM | POA: Diagnosis not present

## 2016-07-15 DIAGNOSIS — Z1389 Encounter for screening for other disorder: Secondary | ICD-10-CM | POA: Diagnosis not present

## 2016-07-15 DIAGNOSIS — Z7189 Other specified counseling: Secondary | ICD-10-CM | POA: Diagnosis not present

## 2016-07-15 DIAGNOSIS — E7800 Pure hypercholesterolemia, unspecified: Secondary | ICD-10-CM | POA: Diagnosis not present

## 2016-07-15 DIAGNOSIS — Z1211 Encounter for screening for malignant neoplasm of colon: Secondary | ICD-10-CM | POA: Diagnosis not present

## 2016-07-15 DIAGNOSIS — R5383 Other fatigue: Secondary | ICD-10-CM | POA: Diagnosis not present

## 2016-07-15 DIAGNOSIS — Z299 Encounter for prophylactic measures, unspecified: Secondary | ICD-10-CM | POA: Diagnosis not present

## 2016-07-15 DIAGNOSIS — Z0000 Encounter for general adult medical examination without abnormal findings: Secondary | ICD-10-CM | POA: Diagnosis not present

## 2016-07-20 DIAGNOSIS — L97822 Non-pressure chronic ulcer of other part of left lower leg with fat layer exposed: Secondary | ICD-10-CM | POA: Diagnosis not present

## 2016-07-20 DIAGNOSIS — L97828 Non-pressure chronic ulcer of other part of left lower leg with other specified severity: Secondary | ICD-10-CM | POA: Diagnosis not present

## 2016-07-20 DIAGNOSIS — C44729 Squamous cell carcinoma of skin of left lower limb, including hip: Secondary | ICD-10-CM | POA: Diagnosis not present

## 2016-07-20 DIAGNOSIS — I83028 Varicose veins of left lower extremity with ulcer other part of lower leg: Secondary | ICD-10-CM | POA: Diagnosis not present

## 2016-07-20 DIAGNOSIS — T8131XD Disruption of external operation (surgical) wound, not elsewhere classified, subsequent encounter: Secondary | ICD-10-CM | POA: Diagnosis not present

## 2016-07-20 DIAGNOSIS — I872 Venous insufficiency (chronic) (peripheral): Secondary | ICD-10-CM | POA: Diagnosis not present

## 2016-07-20 DIAGNOSIS — E7800 Pure hypercholesterolemia, unspecified: Secondary | ICD-10-CM | POA: Diagnosis not present

## 2016-07-27 DIAGNOSIS — L97822 Non-pressure chronic ulcer of other part of left lower leg with fat layer exposed: Secondary | ICD-10-CM | POA: Diagnosis not present

## 2016-07-27 DIAGNOSIS — T8131XA Disruption of external operation (surgical) wound, not elsewhere classified, initial encounter: Secondary | ICD-10-CM | POA: Diagnosis not present

## 2016-07-28 DIAGNOSIS — Z6821 Body mass index (BMI) 21.0-21.9, adult: Secondary | ICD-10-CM | POA: Diagnosis not present

## 2016-07-28 DIAGNOSIS — N390 Urinary tract infection, site not specified: Secondary | ICD-10-CM | POA: Diagnosis not present

## 2016-07-28 DIAGNOSIS — M545 Low back pain: Secondary | ICD-10-CM | POA: Diagnosis not present

## 2016-07-28 DIAGNOSIS — J449 Chronic obstructive pulmonary disease, unspecified: Secondary | ICD-10-CM | POA: Diagnosis not present

## 2016-07-28 DIAGNOSIS — Z87891 Personal history of nicotine dependence: Secondary | ICD-10-CM | POA: Diagnosis not present

## 2016-07-28 DIAGNOSIS — Z299 Encounter for prophylactic measures, unspecified: Secondary | ICD-10-CM | POA: Diagnosis not present

## 2016-08-03 DIAGNOSIS — E7800 Pure hypercholesterolemia, unspecified: Secondary | ICD-10-CM | POA: Diagnosis not present

## 2016-08-03 DIAGNOSIS — I83028 Varicose veins of left lower extremity with ulcer other part of lower leg: Secondary | ICD-10-CM | POA: Diagnosis not present

## 2016-08-03 DIAGNOSIS — J449 Chronic obstructive pulmonary disease, unspecified: Secondary | ICD-10-CM | POA: Diagnosis not present

## 2016-08-03 DIAGNOSIS — L97822 Non-pressure chronic ulcer of other part of left lower leg with fat layer exposed: Secondary | ICD-10-CM | POA: Diagnosis not present

## 2016-08-03 DIAGNOSIS — T8131XA Disruption of external operation (surgical) wound, not elsewhere classified, initial encounter: Secondary | ICD-10-CM | POA: Diagnosis not present

## 2016-08-16 DIAGNOSIS — I83028 Varicose veins of left lower extremity with ulcer other part of lower leg: Secondary | ICD-10-CM | POA: Diagnosis not present

## 2016-08-16 DIAGNOSIS — L97822 Non-pressure chronic ulcer of other part of left lower leg with fat layer exposed: Secondary | ICD-10-CM | POA: Diagnosis not present

## 2016-08-16 DIAGNOSIS — T8131XA Disruption of external operation (surgical) wound, not elsewhere classified, initial encounter: Secondary | ICD-10-CM | POA: Diagnosis not present

## 2016-10-03 DIAGNOSIS — Z299 Encounter for prophylactic measures, unspecified: Secondary | ICD-10-CM | POA: Diagnosis not present

## 2016-10-03 DIAGNOSIS — J449 Chronic obstructive pulmonary disease, unspecified: Secondary | ICD-10-CM | POA: Diagnosis not present

## 2016-10-03 DIAGNOSIS — Z713 Dietary counseling and surveillance: Secondary | ICD-10-CM | POA: Diagnosis not present

## 2016-10-03 DIAGNOSIS — L989 Disorder of the skin and subcutaneous tissue, unspecified: Secondary | ICD-10-CM | POA: Diagnosis not present

## 2016-10-03 DIAGNOSIS — Z6821 Body mass index (BMI) 21.0-21.9, adult: Secondary | ICD-10-CM | POA: Diagnosis not present

## 2016-10-06 DIAGNOSIS — F329 Major depressive disorder, single episode, unspecified: Secondary | ICD-10-CM | POA: Diagnosis not present

## 2016-10-06 DIAGNOSIS — L989 Disorder of the skin and subcutaneous tissue, unspecified: Secondary | ICD-10-CM | POA: Diagnosis not present

## 2016-10-06 DIAGNOSIS — J449 Chronic obstructive pulmonary disease, unspecified: Secondary | ICD-10-CM | POA: Diagnosis not present

## 2016-10-06 DIAGNOSIS — Z87891 Personal history of nicotine dependence: Secondary | ICD-10-CM | POA: Diagnosis not present

## 2016-10-06 DIAGNOSIS — Z299 Encounter for prophylactic measures, unspecified: Secondary | ICD-10-CM | POA: Diagnosis not present

## 2016-10-06 DIAGNOSIS — Z713 Dietary counseling and surveillance: Secondary | ICD-10-CM | POA: Diagnosis not present

## 2016-10-06 DIAGNOSIS — Z6820 Body mass index (BMI) 20.0-20.9, adult: Secondary | ICD-10-CM | POA: Diagnosis not present

## 2016-10-06 DIAGNOSIS — E7800 Pure hypercholesterolemia, unspecified: Secondary | ICD-10-CM | POA: Diagnosis not present

## 2016-10-17 DIAGNOSIS — D485 Neoplasm of uncertain behavior of skin: Secondary | ICD-10-CM | POA: Diagnosis not present

## 2016-10-17 DIAGNOSIS — C44729 Squamous cell carcinoma of skin of left lower limb, including hip: Secondary | ICD-10-CM | POA: Diagnosis not present

## 2016-10-25 DIAGNOSIS — Z87891 Personal history of nicotine dependence: Secondary | ICD-10-CM | POA: Diagnosis not present

## 2016-10-25 DIAGNOSIS — M549 Dorsalgia, unspecified: Secondary | ICD-10-CM | POA: Diagnosis not present

## 2016-10-25 DIAGNOSIS — I700 Atherosclerosis of aorta: Secondary | ICD-10-CM | POA: Diagnosis not present

## 2016-10-25 DIAGNOSIS — M8588 Other specified disorders of bone density and structure, other site: Secondary | ICD-10-CM | POA: Diagnosis not present

## 2016-10-25 DIAGNOSIS — M419 Scoliosis, unspecified: Secondary | ICD-10-CM | POA: Diagnosis not present

## 2016-10-25 DIAGNOSIS — M4186 Other forms of scoliosis, lumbar region: Secondary | ICD-10-CM | POA: Diagnosis not present

## 2016-10-25 DIAGNOSIS — M47814 Spondylosis without myelopathy or radiculopathy, thoracic region: Secondary | ICD-10-CM | POA: Diagnosis not present

## 2016-10-25 DIAGNOSIS — I6529 Occlusion and stenosis of unspecified carotid artery: Secondary | ICD-10-CM | POA: Diagnosis not present

## 2016-10-25 DIAGNOSIS — K219 Gastro-esophageal reflux disease without esophagitis: Secondary | ICD-10-CM | POA: Diagnosis not present

## 2016-10-25 DIAGNOSIS — E7800 Pure hypercholesterolemia, unspecified: Secondary | ICD-10-CM | POA: Diagnosis not present

## 2016-10-25 DIAGNOSIS — Z6820 Body mass index (BMI) 20.0-20.9, adult: Secondary | ICD-10-CM | POA: Diagnosis not present

## 2016-10-25 DIAGNOSIS — M4854XD Collapsed vertebra, not elsewhere classified, thoracic region, subsequent encounter for fracture with routine healing: Secondary | ICD-10-CM | POA: Diagnosis not present

## 2016-10-25 DIAGNOSIS — F329 Major depressive disorder, single episode, unspecified: Secondary | ICD-10-CM | POA: Diagnosis not present

## 2016-10-25 DIAGNOSIS — Z299 Encounter for prophylactic measures, unspecified: Secondary | ICD-10-CM | POA: Diagnosis not present

## 2016-11-03 DIAGNOSIS — Z08 Encounter for follow-up examination after completed treatment for malignant neoplasm: Secondary | ICD-10-CM | POA: Diagnosis not present

## 2016-11-03 DIAGNOSIS — Z85828 Personal history of other malignant neoplasm of skin: Secondary | ICD-10-CM | POA: Diagnosis not present

## 2016-11-08 DIAGNOSIS — Z299 Encounter for prophylactic measures, unspecified: Secondary | ICD-10-CM | POA: Diagnosis not present

## 2016-11-08 DIAGNOSIS — C44721 Squamous cell carcinoma of skin of unspecified lower limb, including hip: Secondary | ICD-10-CM | POA: Diagnosis not present

## 2016-11-08 DIAGNOSIS — G43909 Migraine, unspecified, not intractable, without status migrainosus: Secondary | ICD-10-CM | POA: Diagnosis not present

## 2016-11-08 DIAGNOSIS — L97309 Non-pressure chronic ulcer of unspecified ankle with unspecified severity: Secondary | ICD-10-CM | POA: Diagnosis not present

## 2016-11-08 DIAGNOSIS — Z6820 Body mass index (BMI) 20.0-20.9, adult: Secondary | ICD-10-CM | POA: Diagnosis not present

## 2016-11-08 DIAGNOSIS — K219 Gastro-esophageal reflux disease without esophagitis: Secondary | ICD-10-CM | POA: Diagnosis not present

## 2016-11-08 DIAGNOSIS — J449 Chronic obstructive pulmonary disease, unspecified: Secondary | ICD-10-CM | POA: Diagnosis not present

## 2016-11-08 DIAGNOSIS — M549 Dorsalgia, unspecified: Secondary | ICD-10-CM | POA: Diagnosis not present

## 2016-11-08 DIAGNOSIS — E7800 Pure hypercholesterolemia, unspecified: Secondary | ICD-10-CM | POA: Diagnosis not present

## 2016-11-08 DIAGNOSIS — F411 Generalized anxiety disorder: Secondary | ICD-10-CM | POA: Diagnosis not present

## 2016-11-08 DIAGNOSIS — F329 Major depressive disorder, single episode, unspecified: Secondary | ICD-10-CM | POA: Diagnosis not present

## 2016-11-11 DIAGNOSIS — F329 Major depressive disorder, single episode, unspecified: Secondary | ICD-10-CM | POA: Diagnosis not present

## 2016-11-11 DIAGNOSIS — Z299 Encounter for prophylactic measures, unspecified: Secondary | ICD-10-CM | POA: Diagnosis not present

## 2016-11-11 DIAGNOSIS — J449 Chronic obstructive pulmonary disease, unspecified: Secondary | ICD-10-CM | POA: Diagnosis not present

## 2016-11-11 DIAGNOSIS — K219 Gastro-esophageal reflux disease without esophagitis: Secondary | ICD-10-CM | POA: Diagnosis not present

## 2016-11-11 DIAGNOSIS — E7800 Pure hypercholesterolemia, unspecified: Secondary | ICD-10-CM | POA: Diagnosis not present

## 2016-11-11 DIAGNOSIS — Z6820 Body mass index (BMI) 20.0-20.9, adult: Secondary | ICD-10-CM | POA: Diagnosis not present

## 2016-11-11 DIAGNOSIS — L03119 Cellulitis of unspecified part of limb: Secondary | ICD-10-CM | POA: Diagnosis not present

## 2016-11-11 DIAGNOSIS — Z713 Dietary counseling and surveillance: Secondary | ICD-10-CM | POA: Diagnosis not present

## 2016-12-01 DIAGNOSIS — Z85828 Personal history of other malignant neoplasm of skin: Secondary | ICD-10-CM | POA: Diagnosis not present

## 2016-12-01 DIAGNOSIS — L03116 Cellulitis of left lower limb: Secondary | ICD-10-CM | POA: Diagnosis not present

## 2016-12-01 DIAGNOSIS — Z08 Encounter for follow-up examination after completed treatment for malignant neoplasm: Secondary | ICD-10-CM | POA: Diagnosis not present

## 2016-12-06 DIAGNOSIS — K1370 Unspecified lesions of oral mucosa: Secondary | ICD-10-CM | POA: Diagnosis not present

## 2016-12-07 DIAGNOSIS — E7800 Pure hypercholesterolemia, unspecified: Secondary | ICD-10-CM | POA: Diagnosis not present

## 2016-12-07 DIAGNOSIS — J449 Chronic obstructive pulmonary disease, unspecified: Secondary | ICD-10-CM | POA: Diagnosis not present

## 2016-12-15 DIAGNOSIS — L03116 Cellulitis of left lower limb: Secondary | ICD-10-CM | POA: Diagnosis not present

## 2016-12-27 ENCOUNTER — Ambulatory Visit (INDEPENDENT_AMBULATORY_CARE_PROVIDER_SITE_OTHER): Payer: Medicare Other | Admitting: Ophthalmology

## 2017-01-02 DIAGNOSIS — C44729 Squamous cell carcinoma of skin of left lower limb, including hip: Secondary | ICD-10-CM | POA: Diagnosis not present

## 2017-01-16 DIAGNOSIS — C44729 Squamous cell carcinoma of skin of left lower limb, including hip: Secondary | ICD-10-CM | POA: Diagnosis not present

## 2017-01-27 DIAGNOSIS — D485 Neoplasm of uncertain behavior of skin: Secondary | ICD-10-CM | POA: Diagnosis not present

## 2017-01-27 DIAGNOSIS — L989 Disorder of the skin and subcutaneous tissue, unspecified: Secondary | ICD-10-CM | POA: Diagnosis not present

## 2017-01-27 DIAGNOSIS — L430 Hypertrophic lichen planus: Secondary | ICD-10-CM | POA: Diagnosis not present

## 2017-01-27 DIAGNOSIS — C44729 Squamous cell carcinoma of skin of left lower limb, including hip: Secondary | ICD-10-CM | POA: Diagnosis not present

## 2017-02-07 DIAGNOSIS — K1370 Unspecified lesions of oral mucosa: Secondary | ICD-10-CM | POA: Diagnosis not present

## 2017-02-20 DIAGNOSIS — C44729 Squamous cell carcinoma of skin of left lower limb, including hip: Secondary | ICD-10-CM | POA: Diagnosis not present

## 2017-02-21 DIAGNOSIS — L989 Disorder of the skin and subcutaneous tissue, unspecified: Secondary | ICD-10-CM | POA: Diagnosis not present

## 2017-02-21 DIAGNOSIS — C44729 Squamous cell carcinoma of skin of left lower limb, including hip: Secondary | ICD-10-CM | POA: Diagnosis not present

## 2017-02-21 DIAGNOSIS — Z85828 Personal history of other malignant neoplasm of skin: Secondary | ICD-10-CM | POA: Diagnosis not present

## 2017-02-28 DIAGNOSIS — S81802D Unspecified open wound, left lower leg, subsequent encounter: Secondary | ICD-10-CM | POA: Diagnosis not present

## 2017-03-15 DIAGNOSIS — M79672 Pain in left foot: Secondary | ICD-10-CM | POA: Diagnosis not present

## 2017-03-15 DIAGNOSIS — L110 Acquired keratosis follicularis: Secondary | ICD-10-CM | POA: Diagnosis not present

## 2017-03-15 DIAGNOSIS — M79671 Pain in right foot: Secondary | ICD-10-CM | POA: Diagnosis not present

## 2017-03-15 DIAGNOSIS — L609 Nail disorder, unspecified: Secondary | ICD-10-CM | POA: Diagnosis not present

## 2017-03-31 DIAGNOSIS — Z23 Encounter for immunization: Secondary | ICD-10-CM | POA: Diagnosis not present

## 2017-05-03 DIAGNOSIS — S81802D Unspecified open wound, left lower leg, subsequent encounter: Secondary | ICD-10-CM | POA: Diagnosis not present

## 2017-06-06 DIAGNOSIS — Z85828 Personal history of other malignant neoplasm of skin: Secondary | ICD-10-CM | POA: Diagnosis not present

## 2017-06-06 DIAGNOSIS — L438 Other lichen planus: Secondary | ICD-10-CM | POA: Diagnosis not present

## 2017-06-07 DIAGNOSIS — I739 Peripheral vascular disease, unspecified: Secondary | ICD-10-CM | POA: Diagnosis not present

## 2017-06-07 DIAGNOSIS — M79672 Pain in left foot: Secondary | ICD-10-CM | POA: Diagnosis not present

## 2017-06-07 DIAGNOSIS — L110 Acquired keratosis follicularis: Secondary | ICD-10-CM | POA: Diagnosis not present

## 2017-06-07 DIAGNOSIS — M79671 Pain in right foot: Secondary | ICD-10-CM | POA: Diagnosis not present

## 2017-06-14 DIAGNOSIS — Z299 Encounter for prophylactic measures, unspecified: Secondary | ICD-10-CM | POA: Diagnosis not present

## 2017-06-14 DIAGNOSIS — F329 Major depressive disorder, single episode, unspecified: Secondary | ICD-10-CM | POA: Diagnosis not present

## 2017-06-14 DIAGNOSIS — M545 Low back pain: Secondary | ICD-10-CM | POA: Diagnosis not present

## 2017-06-14 DIAGNOSIS — Z681 Body mass index (BMI) 19 or less, adult: Secondary | ICD-10-CM | POA: Diagnosis not present

## 2017-06-14 DIAGNOSIS — J449 Chronic obstructive pulmonary disease, unspecified: Secondary | ICD-10-CM | POA: Diagnosis not present

## 2017-06-14 DIAGNOSIS — R21 Rash and other nonspecific skin eruption: Secondary | ICD-10-CM | POA: Diagnosis not present

## 2017-06-27 DIAGNOSIS — K1370 Unspecified lesions of oral mucosa: Secondary | ICD-10-CM | POA: Diagnosis not present

## 2017-07-20 DIAGNOSIS — L438 Other lichen planus: Secondary | ICD-10-CM | POA: Diagnosis not present

## 2017-07-20 DIAGNOSIS — Z85828 Personal history of other malignant neoplasm of skin: Secondary | ICD-10-CM | POA: Diagnosis not present

## 2017-07-21 DIAGNOSIS — Z299 Encounter for prophylactic measures, unspecified: Secondary | ICD-10-CM | POA: Diagnosis not present

## 2017-07-21 DIAGNOSIS — Z79899 Other long term (current) drug therapy: Secondary | ICD-10-CM | POA: Diagnosis not present

## 2017-07-21 DIAGNOSIS — Z7189 Other specified counseling: Secondary | ICD-10-CM | POA: Diagnosis not present

## 2017-07-21 DIAGNOSIS — E559 Vitamin D deficiency, unspecified: Secondary | ICD-10-CM | POA: Diagnosis not present

## 2017-07-21 DIAGNOSIS — Z1211 Encounter for screening for malignant neoplasm of colon: Secondary | ICD-10-CM | POA: Diagnosis not present

## 2017-07-21 DIAGNOSIS — R5383 Other fatigue: Secondary | ICD-10-CM | POA: Diagnosis not present

## 2017-07-21 DIAGNOSIS — M810 Age-related osteoporosis without current pathological fracture: Secondary | ICD-10-CM | POA: Diagnosis not present

## 2017-07-21 DIAGNOSIS — Z1331 Encounter for screening for depression: Secondary | ICD-10-CM | POA: Diagnosis not present

## 2017-07-21 DIAGNOSIS — Z0000 Encounter for general adult medical examination without abnormal findings: Secondary | ICD-10-CM | POA: Diagnosis not present

## 2017-07-21 DIAGNOSIS — Z1339 Encounter for screening examination for other mental health and behavioral disorders: Secondary | ICD-10-CM | POA: Diagnosis not present

## 2017-07-21 DIAGNOSIS — Z681 Body mass index (BMI) 19 or less, adult: Secondary | ICD-10-CM | POA: Diagnosis not present

## 2017-07-21 DIAGNOSIS — E7800 Pure hypercholesterolemia, unspecified: Secondary | ICD-10-CM | POA: Diagnosis not present

## 2017-08-08 DIAGNOSIS — E2839 Other primary ovarian failure: Secondary | ICD-10-CM | POA: Diagnosis not present

## 2017-08-30 DIAGNOSIS — L02611 Cutaneous abscess of right foot: Secondary | ICD-10-CM | POA: Diagnosis not present

## 2017-08-30 DIAGNOSIS — M79671 Pain in right foot: Secondary | ICD-10-CM | POA: Diagnosis not present

## 2017-08-30 DIAGNOSIS — I739 Peripheral vascular disease, unspecified: Secondary | ICD-10-CM | POA: Diagnosis not present

## 2017-08-30 DIAGNOSIS — M79672 Pain in left foot: Secondary | ICD-10-CM | POA: Diagnosis not present

## 2017-08-30 DIAGNOSIS — L110 Acquired keratosis follicularis: Secondary | ICD-10-CM | POA: Diagnosis not present

## 2017-10-02 DIAGNOSIS — I8390 Asymptomatic varicose veins of unspecified lower extremity: Secondary | ICD-10-CM | POA: Diagnosis not present

## 2017-10-02 DIAGNOSIS — Z299 Encounter for prophylactic measures, unspecified: Secondary | ICD-10-CM | POA: Diagnosis not present

## 2017-10-02 DIAGNOSIS — J449 Chronic obstructive pulmonary disease, unspecified: Secondary | ICD-10-CM | POA: Diagnosis not present

## 2017-10-02 DIAGNOSIS — I83811 Varicose veins of right lower extremities with pain: Secondary | ICD-10-CM | POA: Diagnosis not present

## 2017-10-02 DIAGNOSIS — L0390 Cellulitis, unspecified: Secondary | ICD-10-CM | POA: Diagnosis not present

## 2017-10-02 DIAGNOSIS — Z681 Body mass index (BMI) 19 or less, adult: Secondary | ICD-10-CM | POA: Diagnosis not present

## 2017-10-02 DIAGNOSIS — Z87891 Personal history of nicotine dependence: Secondary | ICD-10-CM | POA: Diagnosis not present

## 2017-10-02 DIAGNOSIS — E7800 Pure hypercholesterolemia, unspecified: Secondary | ICD-10-CM | POA: Diagnosis not present

## 2017-10-02 DIAGNOSIS — J849 Interstitial pulmonary disease, unspecified: Secondary | ICD-10-CM | POA: Diagnosis not present

## 2017-10-02 DIAGNOSIS — C44721 Squamous cell carcinoma of skin of unspecified lower limb, including hip: Secondary | ICD-10-CM | POA: Diagnosis not present

## 2017-10-02 DIAGNOSIS — K219 Gastro-esophageal reflux disease without esophagitis: Secondary | ICD-10-CM | POA: Diagnosis not present

## 2017-10-03 DIAGNOSIS — J449 Chronic obstructive pulmonary disease, unspecified: Secondary | ICD-10-CM | POA: Diagnosis not present

## 2017-10-03 DIAGNOSIS — E7800 Pure hypercholesterolemia, unspecified: Secondary | ICD-10-CM | POA: Diagnosis not present

## 2017-10-16 DIAGNOSIS — Z299 Encounter for prophylactic measures, unspecified: Secondary | ICD-10-CM | POA: Diagnosis not present

## 2017-10-16 DIAGNOSIS — J449 Chronic obstructive pulmonary disease, unspecified: Secondary | ICD-10-CM | POA: Diagnosis not present

## 2017-10-16 DIAGNOSIS — J849 Interstitial pulmonary disease, unspecified: Secondary | ICD-10-CM | POA: Diagnosis not present

## 2017-10-16 DIAGNOSIS — C44721 Squamous cell carcinoma of skin of unspecified lower limb, including hip: Secondary | ICD-10-CM | POA: Diagnosis not present

## 2017-10-16 DIAGNOSIS — Z681 Body mass index (BMI) 19 or less, adult: Secondary | ICD-10-CM | POA: Diagnosis not present

## 2017-11-15 DIAGNOSIS — M79672 Pain in left foot: Secondary | ICD-10-CM | POA: Diagnosis not present

## 2017-11-15 DIAGNOSIS — L110 Acquired keratosis follicularis: Secondary | ICD-10-CM | POA: Diagnosis not present

## 2017-11-15 DIAGNOSIS — M79671 Pain in right foot: Secondary | ICD-10-CM | POA: Diagnosis not present

## 2017-11-15 DIAGNOSIS — I739 Peripheral vascular disease, unspecified: Secondary | ICD-10-CM | POA: Diagnosis not present

## 2017-11-20 DIAGNOSIS — L438 Other lichen planus: Secondary | ICD-10-CM | POA: Diagnosis not present

## 2017-11-20 DIAGNOSIS — C44729 Squamous cell carcinoma of skin of left lower limb, including hip: Secondary | ICD-10-CM | POA: Diagnosis not present

## 2017-11-20 DIAGNOSIS — C44722 Squamous cell carcinoma of skin of right lower limb, including hip: Secondary | ICD-10-CM | POA: Diagnosis not present

## 2017-11-20 DIAGNOSIS — D485 Neoplasm of uncertain behavior of skin: Secondary | ICD-10-CM | POA: Diagnosis not present

## 2018-01-08 DIAGNOSIS — L438 Other lichen planus: Secondary | ICD-10-CM | POA: Diagnosis not present

## 2018-01-08 DIAGNOSIS — C44722 Squamous cell carcinoma of skin of right lower limb, including hip: Secondary | ICD-10-CM | POA: Diagnosis not present

## 2018-01-09 DIAGNOSIS — J449 Chronic obstructive pulmonary disease, unspecified: Secondary | ICD-10-CM | POA: Diagnosis not present

## 2018-01-09 DIAGNOSIS — Z713 Dietary counseling and surveillance: Secondary | ICD-10-CM | POA: Diagnosis not present

## 2018-01-09 DIAGNOSIS — Z681 Body mass index (BMI) 19 or less, adult: Secondary | ICD-10-CM | POA: Diagnosis not present

## 2018-01-09 DIAGNOSIS — Z299 Encounter for prophylactic measures, unspecified: Secondary | ICD-10-CM | POA: Diagnosis not present

## 2018-01-09 DIAGNOSIS — M545 Low back pain: Secondary | ICD-10-CM | POA: Diagnosis not present

## 2018-01-29 DIAGNOSIS — C44722 Squamous cell carcinoma of skin of right lower limb, including hip: Secondary | ICD-10-CM | POA: Diagnosis not present

## 2018-01-29 DIAGNOSIS — Z85828 Personal history of other malignant neoplasm of skin: Secondary | ICD-10-CM | POA: Diagnosis not present

## 2018-01-29 DIAGNOSIS — L430 Hypertrophic lichen planus: Secondary | ICD-10-CM | POA: Diagnosis not present

## 2018-02-07 DIAGNOSIS — L110 Acquired keratosis follicularis: Secondary | ICD-10-CM | POA: Diagnosis not present

## 2018-02-07 DIAGNOSIS — I739 Peripheral vascular disease, unspecified: Secondary | ICD-10-CM | POA: Diagnosis not present

## 2018-02-07 DIAGNOSIS — M79672 Pain in left foot: Secondary | ICD-10-CM | POA: Diagnosis not present

## 2018-02-07 DIAGNOSIS — M79671 Pain in right foot: Secondary | ICD-10-CM | POA: Diagnosis not present

## 2018-02-16 DIAGNOSIS — Z713 Dietary counseling and surveillance: Secondary | ICD-10-CM | POA: Diagnosis not present

## 2018-02-16 DIAGNOSIS — Z681 Body mass index (BMI) 19 or less, adult: Secondary | ICD-10-CM | POA: Diagnosis not present

## 2018-02-16 DIAGNOSIS — F411 Generalized anxiety disorder: Secondary | ICD-10-CM | POA: Diagnosis not present

## 2018-02-16 DIAGNOSIS — Z299 Encounter for prophylactic measures, unspecified: Secondary | ICD-10-CM | POA: Diagnosis not present

## 2018-02-19 DIAGNOSIS — C44722 Squamous cell carcinoma of skin of right lower limb, including hip: Secondary | ICD-10-CM | POA: Diagnosis not present

## 2018-03-13 DIAGNOSIS — J449 Chronic obstructive pulmonary disease, unspecified: Secondary | ICD-10-CM | POA: Diagnosis not present

## 2018-03-13 DIAGNOSIS — E7800 Pure hypercholesterolemia, unspecified: Secondary | ICD-10-CM | POA: Diagnosis not present

## 2018-03-13 DIAGNOSIS — Z681 Body mass index (BMI) 19 or less, adult: Secondary | ICD-10-CM | POA: Diagnosis not present

## 2018-03-13 DIAGNOSIS — Z299 Encounter for prophylactic measures, unspecified: Secondary | ICD-10-CM | POA: Diagnosis not present

## 2018-03-13 DIAGNOSIS — I8390 Asymptomatic varicose veins of unspecified lower extremity: Secondary | ICD-10-CM | POA: Diagnosis not present

## 2018-03-13 DIAGNOSIS — J849 Interstitial pulmonary disease, unspecified: Secondary | ICD-10-CM | POA: Diagnosis not present

## 2018-03-29 DIAGNOSIS — C44729 Squamous cell carcinoma of skin of left lower limb, including hip: Secondary | ICD-10-CM | POA: Diagnosis not present

## 2018-04-09 DIAGNOSIS — Z23 Encounter for immunization: Secondary | ICD-10-CM | POA: Diagnosis not present

## 2018-04-10 ENCOUNTER — Encounter (INDEPENDENT_AMBULATORY_CARE_PROVIDER_SITE_OTHER): Payer: Medicare Other | Admitting: Ophthalmology

## 2018-04-10 DIAGNOSIS — H353134 Nonexudative age-related macular degeneration, bilateral, advanced atrophic with subfoveal involvement: Secondary | ICD-10-CM | POA: Diagnosis not present

## 2018-04-10 DIAGNOSIS — H43813 Vitreous degeneration, bilateral: Secondary | ICD-10-CM | POA: Diagnosis not present

## 2018-04-18 DIAGNOSIS — M79672 Pain in left foot: Secondary | ICD-10-CM | POA: Diagnosis not present

## 2018-04-18 DIAGNOSIS — I739 Peripheral vascular disease, unspecified: Secondary | ICD-10-CM | POA: Diagnosis not present

## 2018-04-18 DIAGNOSIS — M79671 Pain in right foot: Secondary | ICD-10-CM | POA: Diagnosis not present

## 2018-04-18 DIAGNOSIS — L110 Acquired keratosis follicularis: Secondary | ICD-10-CM | POA: Diagnosis not present

## 2018-05-03 DIAGNOSIS — C44729 Squamous cell carcinoma of skin of left lower limb, including hip: Secondary | ICD-10-CM | POA: Diagnosis not present

## 2018-05-28 DIAGNOSIS — F329 Major depressive disorder, single episode, unspecified: Secondary | ICD-10-CM | POA: Diagnosis not present

## 2018-05-28 DIAGNOSIS — Z299 Encounter for prophylactic measures, unspecified: Secondary | ICD-10-CM | POA: Diagnosis not present

## 2018-05-28 DIAGNOSIS — Z681 Body mass index (BMI) 19 or less, adult: Secondary | ICD-10-CM | POA: Diagnosis not present

## 2018-05-28 DIAGNOSIS — M549 Dorsalgia, unspecified: Secondary | ICD-10-CM | POA: Diagnosis not present

## 2018-05-28 DIAGNOSIS — J449 Chronic obstructive pulmonary disease, unspecified: Secondary | ICD-10-CM | POA: Diagnosis not present

## 2018-06-12 DIAGNOSIS — K1321 Leukoplakia of oral mucosa, including tongue: Secondary | ICD-10-CM | POA: Diagnosis not present

## 2018-06-18 DIAGNOSIS — J449 Chronic obstructive pulmonary disease, unspecified: Secondary | ICD-10-CM | POA: Diagnosis not present

## 2018-06-18 DIAGNOSIS — E7800 Pure hypercholesterolemia, unspecified: Secondary | ICD-10-CM | POA: Diagnosis not present

## 2018-07-04 DIAGNOSIS — M79671 Pain in right foot: Secondary | ICD-10-CM | POA: Diagnosis not present

## 2018-07-04 DIAGNOSIS — L110 Acquired keratosis follicularis: Secondary | ICD-10-CM | POA: Diagnosis not present

## 2018-07-04 DIAGNOSIS — M79672 Pain in left foot: Secondary | ICD-10-CM | POA: Diagnosis not present

## 2018-07-04 DIAGNOSIS — I739 Peripheral vascular disease, unspecified: Secondary | ICD-10-CM | POA: Diagnosis not present

## 2018-07-23 DIAGNOSIS — Z1331 Encounter for screening for depression: Secondary | ICD-10-CM | POA: Diagnosis not present

## 2018-07-23 DIAGNOSIS — F411 Generalized anxiety disorder: Secondary | ICD-10-CM | POA: Diagnosis not present

## 2018-07-23 DIAGNOSIS — Z7189 Other specified counseling: Secondary | ICD-10-CM | POA: Diagnosis not present

## 2018-07-23 DIAGNOSIS — J449 Chronic obstructive pulmonary disease, unspecified: Secondary | ICD-10-CM | POA: Diagnosis not present

## 2018-07-23 DIAGNOSIS — Z299 Encounter for prophylactic measures, unspecified: Secondary | ICD-10-CM | POA: Diagnosis not present

## 2018-07-23 DIAGNOSIS — Z681 Body mass index (BMI) 19 or less, adult: Secondary | ICD-10-CM | POA: Diagnosis not present

## 2018-07-23 DIAGNOSIS — Z79899 Other long term (current) drug therapy: Secondary | ICD-10-CM | POA: Diagnosis not present

## 2018-07-23 DIAGNOSIS — Z1339 Encounter for screening examination for other mental health and behavioral disorders: Secondary | ICD-10-CM | POA: Diagnosis not present

## 2018-07-23 DIAGNOSIS — E7800 Pure hypercholesterolemia, unspecified: Secondary | ICD-10-CM | POA: Diagnosis not present

## 2018-07-23 DIAGNOSIS — Z0000 Encounter for general adult medical examination without abnormal findings: Secondary | ICD-10-CM | POA: Diagnosis not present

## 2018-07-27 NOTE — H&P (Signed)
  Meredith Stewart is an 78 y.o. female.   Chief Complaint: oral lesions HPI: multiple oral cancers and leukoplakia over the years.   Past Medical History:  Diagnosis Date  . Asthma   . GERD (gastroesophageal reflux disease)   . Hyperlipidemia   . MVP (mitral valve prolapse)   . 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 Tobacco abuse    smoked x 35 years, quit 12 years ago  . Wears glasses     Past Surgical History:  Procedure Laterality Date  . CARDIAC CATHETERIZATION     10/13  . DILATION AND CURETTAGE OF UTERUS    . EXCISION OF ORAL TUMOR  4/12   rt bucca-squamus cell ca  . EXCISION OF TONGUE LESION WITH LASER N/A 01/09/2013   Procedure: ORAL BIOPSY AND LASER ABLATION;  Surgeon: [name], MD;  Location: [facility];  Service: ENT;  Laterality: N/A;  . EXCISION ORAL LESION WITH CO2 LASER Right 11/09/2015   Procedure: EXCISION ORAL LESION WITH CO2 LASER AND FROZEN SECTION ;  Surgeon: [name], MD;  Location: [facility];  Service: ENT;  Laterality: Right;  . EYE SURGERY     [facility]  . LEFT HEART CATHETERIZATION WITH CORONARY ANGIOGRAM N/A 05/18/2012   Procedure: LEFT HEART CATHETERIZATION WITH CORONARY ANGIOGRAM;  Surgeon: Josue Hector, MD;  Location: [facility] CATH [facility];  Service: Cardiovascular;  Laterality: N/A;    No family history on file. Social History:  reports that she quit smoking about 19 years ago. Her smoking use included cigarettes. She has a 35.00 pack-year smoking history. She has never used smokeless tobacco. She reports that she does not drink alcohol or use drugs.  Allergies:  Allergies  Allergen Reactions  . Erythromycin Hives  . Sulfa Antibiotics     unknown    No medications prior to admission.    No results found for this or any previous visit (from the past 48 hour(s)). No results found.  ROS: otherwise negative  There were no vitals taken for this visit.  PHYSICAL EXAM: Overall  appearance:  Healthy appearing, in no distress Head:  Normocephalic, atraumatic. Ears: External auditory canals are clear; tympanic membranes are intact and the middle ears are free of any effusion. Nose: External nose is healthy in appearance. Internal nasal exam free of any lesions or obstruction. Oral Cavity/pharynx:  there is a patch of dense leukoplakia along the posterior buccal mucosa on the right adjacent to the maxillary posterior molars. There is additional leukoplakia along the anterior gingiva where she has significant gum disease and exposed roots. . Hypopharynx/Larynx: no signs of any mucosal lesions or masses identified. Vocal cords move normally. Neuro:  No identifiable neurologic deficits. Neck: No palpable neck masses.  Studies Reviewed: none    Assessment/Plan Oral leukoplakia, recommend laser excision and biopsy.  [name] 07/27/2018, 5:28 PM

## 2018-08-02 ENCOUNTER — Other Ambulatory Visit: Payer: Self-pay

## 2018-08-02 ENCOUNTER — Encounter (HOSPITAL_BASED_OUTPATIENT_CLINIC_OR_DEPARTMENT_OTHER): Payer: Self-pay | Admitting: *Deleted

## 2018-08-06 DIAGNOSIS — C44729 Squamous cell carcinoma of skin of left lower limb, including hip: Secondary | ICD-10-CM | POA: Diagnosis not present

## 2018-08-13 ENCOUNTER — Encounter (HOSPITAL_BASED_OUTPATIENT_CLINIC_OR_DEPARTMENT_OTHER)
Admission: RE | Disposition: A | Discharge status: Home / Self Care | Payer: Self-pay | Source: Home / Self Care | Attending: Otolaryngology

## 2018-08-13 ENCOUNTER — Other Ambulatory Visit: Payer: Self-pay

## 2018-08-13 ENCOUNTER — Ambulatory Visit (HOSPITAL_BASED_OUTPATIENT_CLINIC_OR_DEPARTMENT_OTHER)
Admission: RE | Admit: 2018-08-13 | Discharge: 2018-08-13 | Disposition: A | Discharge status: Home / Self Care | Payer: Medicare Other | Attending: Otolaryngology | Admitting: Otolaryngology

## 2018-08-13 ENCOUNTER — Encounter (HOSPITAL_BASED_OUTPATIENT_CLINIC_OR_DEPARTMENT_OTHER): Payer: Self-pay

## 2018-08-13 ENCOUNTER — Ambulatory Visit (HOSPITAL_BASED_OUTPATIENT_CLINIC_OR_DEPARTMENT_OTHER): Payer: Medicare Other | Admitting: Certified Registered Nurse Anesthetist

## 2018-08-13 DIAGNOSIS — Z87891 Personal history of nicotine dependence: Secondary | ICD-10-CM | POA: Insufficient documentation

## 2018-08-13 DIAGNOSIS — C059 Malignant neoplasm of palate, unspecified: Secondary | ICD-10-CM | POA: Diagnosis not present

## 2018-08-13 DIAGNOSIS — I341 Nonrheumatic mitral (valve) prolapse: Secondary | ICD-10-CM | POA: Diagnosis not present

## 2018-08-13 DIAGNOSIS — K1370 Unspecified lesions of oral mucosa: Secondary | ICD-10-CM | POA: Diagnosis not present

## 2018-08-13 DIAGNOSIS — K219 Gastro-esophageal reflux disease without esophagitis: Secondary | ICD-10-CM | POA: Insufficient documentation

## 2018-08-13 DIAGNOSIS — J45909 Unspecified asthma, uncomplicated: Secondary | ICD-10-CM | POA: Insufficient documentation

## 2018-08-13 DIAGNOSIS — C050 Malignant neoplasm of hard palate: Secondary | ICD-10-CM | POA: Insufficient documentation

## 2018-08-13 DIAGNOSIS — K1321 Leukoplakia of oral mucosa, including tongue: Secondary | ICD-10-CM | POA: Diagnosis not present

## 2018-08-13 DIAGNOSIS — K061 Gingival enlargement: Secondary | ICD-10-CM | POA: Diagnosis not present

## 2018-08-13 DIAGNOSIS — Z882 Allergy status to sulfonamides status: Secondary | ICD-10-CM | POA: Diagnosis not present

## 2018-08-13 DIAGNOSIS — E785 Hyperlipidemia, unspecified: Secondary | ICD-10-CM | POA: Diagnosis not present

## 2018-08-13 DIAGNOSIS — K136 Irritative hyperplasia of oral mucosa: Secondary | ICD-10-CM | POA: Diagnosis not present

## 2018-08-13 DIAGNOSIS — Z85818 Personal history of malignant neoplasm of other sites of lip, oral cavity, and pharynx: Secondary | ICD-10-CM | POA: Insufficient documentation

## 2018-08-13 DIAGNOSIS — Z79899 Other long term (current) drug therapy: Secondary | ICD-10-CM | POA: Diagnosis not present

## 2018-08-13 HISTORY — PX: EXCISION ORAL LESION WITH CO2 LASER: SHX6461

## 2018-08-13 SURGERY — DESTRUCTION, LESION, MOUTH, USING CO2 LASER
Anesthesia: General | Site: Mouth

## 2018-08-13 MED ORDER — HYDROCODONE-ACETAMINOPHEN 7.5-325 MG PO TABS: 1.0000 | ORAL_TABLET | Freq: Once | ORAL | Status: DC | PRN

## 2018-08-13 MED ORDER — AMOXICILLIN 500 MG PO CAPS
1000.0000 mg | ORAL_CAPSULE | Freq: Once | ORAL | Status: AC
  Administered 2018-08-13: 1000 mg via ORAL
  Filled 2018-08-13: qty 2

## 2018-08-13 MED ORDER — LACTATED RINGERS IV SOLN
INTRAVENOUS | Status: DC
  Administered 2018-08-13 (×2): via INTRAVENOUS

## 2018-08-13 MED ORDER — FENTANYL CITRATE (PF) 100 MCG/2ML IJ SOLN
INTRAMUSCULAR | Status: AC
  Filled 2018-08-13: qty 2

## 2018-08-13 MED ORDER — MEPERIDINE HCL 25 MG/ML IJ SOLN: 6.2500 mg | INTRAMUSCULAR | Status: DC | PRN

## 2018-08-13 MED ORDER — SILVER NITRATE-POT NITRATE 75-25 % EX MISC
CUTANEOUS | Status: DC | PRN
  Administered 2018-08-13: 5

## 2018-08-13 MED ORDER — ONDANSETRON HCL 4 MG/2ML IJ SOLN
INTRAMUSCULAR | Status: AC
  Filled 2018-08-13: qty 2

## 2018-08-13 MED ORDER — EPHEDRINE 5 MG/ML INJ
INTRAVENOUS | Status: AC
  Filled 2018-08-13: qty 10

## 2018-08-13 MED ORDER — FENTANYL CITRATE (PF) 100 MCG/2ML IJ SOLN
25.0000 ug | INTRAMUSCULAR | Status: DC | PRN
  Administered 2018-08-13: 25 ug via INTRAVENOUS

## 2018-08-13 MED ORDER — SUCCINYLCHOLINE CHLORIDE 200 MG/10ML IV SOSY
PREFILLED_SYRINGE | INTRAVENOUS | Status: DC | PRN
  Administered 2018-08-13: 80 mg via INTRAVENOUS

## 2018-08-13 MED ORDER — LIDOCAINE 2% (20 MG/ML) 5 ML SYRINGE
INTRAMUSCULAR | Status: AC
  Filled 2018-08-13: qty 5

## 2018-08-13 MED ORDER — DEXAMETHASONE SODIUM PHOSPHATE 10 MG/ML IJ SOLN
INTRAMUSCULAR | Status: AC
  Filled 2018-08-13: qty 1

## 2018-08-13 MED ORDER — MIDAZOLAM HCL 2 MG/2ML IJ SOLN: 1.0000 mg | INTRAMUSCULAR | Status: DC | PRN

## 2018-08-13 MED ORDER — LIDOCAINE-EPINEPHRINE 1 %-1:100000 IJ SOLN
INTRAMUSCULAR | Status: DC | PRN
  Administered 2018-08-13: 1.5 mL

## 2018-08-13 MED ORDER — ONDANSETRON HCL 4 MG/2ML IJ SOLN
INTRAMUSCULAR | Status: DC | PRN
  Administered 2018-08-13: 4 mg via INTRAVENOUS

## 2018-08-13 MED ORDER — ONDANSETRON HCL 4 MG/2ML IJ SOLN: 4.0000 mg | Freq: Once | INTRAMUSCULAR | Status: DC | PRN

## 2018-08-13 MED ORDER — SCOPOLAMINE 1 MG/3DAYS TD PT72: 1.0000 | MEDICATED_PATCH | Freq: Once | TRANSDERMAL | Status: DC | PRN

## 2018-08-13 MED ORDER — DEXAMETHASONE SODIUM PHOSPHATE 10 MG/ML IJ SOLN
INTRAMUSCULAR | Status: DC | PRN
  Administered 2018-08-13: 10 mg via INTRAVENOUS

## 2018-08-13 MED ORDER — FENTANYL CITRATE (PF) 100 MCG/2ML IJ SOLN: 50.0000 ug | INTRAMUSCULAR | Status: DC | PRN

## 2018-08-13 MED ORDER — PHENYLEPHRINE 40 MCG/ML (10ML) SYRINGE FOR IV PUSH (FOR BLOOD PRESSURE SUPPORT)
PREFILLED_SYRINGE | INTRAVENOUS | Status: AC
  Filled 2018-08-13: qty 10

## 2018-08-13 MED ORDER — SUCCINYLCHOLINE CHLORIDE 200 MG/10ML IV SOSY
PREFILLED_SYRINGE | INTRAVENOUS | Status: AC
  Filled 2018-08-13: qty 10

## 2018-08-13 MED ORDER — FENTANYL CITRATE (PF) 100 MCG/2ML IJ SOLN
INTRAMUSCULAR | Status: DC | PRN
  Administered 2018-08-13 (×2): 50 ug via INTRAVENOUS

## 2018-08-13 MED ORDER — LIDOCAINE 2% (20 MG/ML) 5 ML SYRINGE
INTRAMUSCULAR | Status: DC | PRN
  Administered 2018-08-13: 60 mg via INTRAVENOUS

## 2018-08-13 MED ORDER — EPHEDRINE SULFATE-NACL 50-0.9 MG/10ML-% IV SOSY
PREFILLED_SYRINGE | INTRAVENOUS | Status: DC | PRN
  Administered 2018-08-13 (×3): 10 mg via INTRAVENOUS

## 2018-08-13 MED ORDER — SILVER NITRATE-POT NITRATE 75-25 % EX MISC
CUTANEOUS | Status: AC
  Filled 2018-08-13: qty 1

## 2018-08-13 MED ORDER — PHENYLEPHRINE 40 MCG/ML (10ML) SYRINGE FOR IV PUSH (FOR BLOOD PRESSURE SUPPORT)
PREFILLED_SYRINGE | INTRAVENOUS | Status: DC | PRN
  Administered 2018-08-13: 40 ug via INTRAVENOUS
  Administered 2018-08-13: 120 ug via INTRAVENOUS
  Administered 2018-08-13 (×2): 80 ug via INTRAVENOUS

## 2018-08-13 MED ORDER — PROPOFOL 10 MG/ML IV BOLUS
INTRAVENOUS | Status: DC | PRN
  Administered 2018-08-13: 120 mg via INTRAVENOUS

## 2018-08-13 SURGICAL SUPPLY — 42 items
BLADE SURG 15 STRL LF DISP TIS (BLADE) IMPLANT
BLADE SURG 15 STRL SS (BLADE) ×3
BNDG EYE OVAL (GAUZE/BANDAGES/DRESSINGS) ×2 IMPLANT
CANISTER SUCT 1200ML W/VALVE (MISCELLANEOUS) ×3 IMPLANT
COAGULATOR SUCT 6 FR SWTCH (ELECTROSURGICAL)
COAGULATOR SUCT SWTCH 10FR 6 (ELECTROSURGICAL) IMPLANT
COVER MAYO STAND STRL (DRAPES) ×3 IMPLANT
COVER WAND RF STERILE (DRAPES) IMPLANT
DEPRESSOR TONGUE BLADE STERILE (MISCELLANEOUS) ×3 IMPLANT
ELECT COATED BLADE 2.86 ST (ELECTRODE) IMPLANT
ELECT REM PT RETURN 9FT ADLT (ELECTROSURGICAL) ×3
ELECTRODE REM PT RTRN 9FT ADLT (ELECTROSURGICAL) IMPLANT
FILTER 7/8 IN (FILTER) ×3 IMPLANT
GAUZE SPONGE 4X4 12PLY STRL LF (GAUZE/BANDAGES/DRESSINGS) IMPLANT
GLOVE BIOGEL PI IND STRL 7.0 (GLOVE) IMPLANT
GLOVE BIOGEL PI INDICATOR 7.0 (GLOVE) ×2
GLOVE ECLIPSE 6.5 STRL STRAW (GLOVE) ×2 IMPLANT
GLOVE ECLIPSE 7.5 STRL STRAW (GLOVE) ×3 IMPLANT
GOWN STRL REUS W/ TWL LRG LVL3 (GOWN DISPOSABLE) IMPLANT
GOWN STRL REUS W/TWL LRG LVL3 (GOWN DISPOSABLE) ×6
MARKER SKIN DUAL TIP RULER LAB (MISCELLANEOUS) IMPLANT
NDL HYPO 30GX1 BEV (NEEDLE) IMPLANT
NDL PRECISIONGLIDE 27X1.5 (NEEDLE) ×1 IMPLANT
NEEDLE HYPO 30GX1 BEV (NEEDLE) ×3 IMPLANT
NEEDLE PRECISIONGLIDE 27X1.5 (NEEDLE) IMPLANT
NS IRRIG 1000ML POUR BTL (IV SOLUTION) ×3 IMPLANT
PACK BASIN DAY SURGERY FS (CUSTOM PROCEDURE TRAY) IMPLANT
PATTIES SURGICAL .5 X3 (DISPOSABLE) IMPLANT
PENCIL FOOT CONTROL (ELECTRODE) IMPLANT
PUNCH BIOPSY DERMAL 4MM (MISCELLANEOUS) ×2 IMPLANT
REDUCTION FITTING 1/4 IN (FILTER) ×2 IMPLANT
SHEET MEDIUM DRAPE 40X70 STRL (DRAPES) ×3 IMPLANT
SLEEVE SCD COMPRESS KNEE MED (MISCELLANEOUS) IMPLANT
SUT CHROMIC 4 0 P 3 18 (SUTURE) IMPLANT
SUT SILK 3 0 PS 1 (SUTURE) IMPLANT
SUT VIC AB 3-0 SH 27 (SUTURE)
SUT VIC AB 3-0 SH 27X BRD (SUTURE) IMPLANT
SYR BULB 3OZ (MISCELLANEOUS) ×2 IMPLANT
SYR CONTROL 10ML LL (SYRINGE) ×2 IMPLANT
TOWEL GREEN STERILE FF (TOWEL DISPOSABLE) ×3 IMPLANT
TUBE CONNECTING 20'X1/4 (TUBING) ×1
TUBE CONNECTING 20X1/4 (TUBING) ×2 IMPLANT

## 2018-08-13 NOTE — Anesthesia Procedure Notes (Signed)
Procedure Name: Intubation Date/Time: 08/13/2018 8:05 AM Performed by: Genelle Bal, CRNA Pre-anesthesia Checklist: Patient identified, Emergency Drugs available, Suction available and Patient being monitored Patient Re-evaluated:Patient Re-evaluated prior to induction Oxygen Delivery Method: Circle system utilized Preoxygenation: Pre-oxygenation with 100% oxygen Induction Type: IV induction Ventilation: Mask ventilation without difficulty Laryngoscope Size: Miller and 2 Grade View: Grade I Tube type: Oral Tube size: 6.0 mm Number of attempts: 1 Airway Equipment and Method: Stylet Placement Confirmation: ETT inserted through vocal cords under direct vision,  positive ETCO2 and breath sounds checked- equal and bilateral Secured at: 20 cm Tube secured with: Tape Dental Injury: Teeth and Oropharynx as per pre-operative assessment

## 2018-08-13 NOTE — Discharge Instructions (Signed)
Rinse mouth with salt water 4 times daily.  Brush teeth as you normally would.  Use Tylenol/Motrin as needed for pain.      Post Anesthesia Home Care Instructions  Activity: Get plenty of rest for the remainder of the day. A responsible individual must stay with you for 24 hours following the procedure.  For the next 24 hours, DO NOT: -Drive a car -Paediatric nurse -Drink alcoholic beverages -Take any medication unless instructed by your physician -Make any legal decisions or sign important papers.  Meals: Start with liquid foods such as gelatin or soup. Progress to regular foods as tolerated. Avoid greasy, spicy, heavy foods. If nausea and/or vomiting occur, drink only clear liquids until the nausea and/or vomiting subsides. Call your physician if vomiting continues.  Special Instructions/Symptoms: Your throat may feel dry or sore from the anesthesia or the breathing tube placed in your throat during surgery. If this causes discomfort, gargle with warm salt water. The discomfort should disappear within 24 hours.  If you had a scopolamine patch placed behind your ear for the management of post- operative nausea and/or vomiting:  1. The medication in the patch is effective for 72 hours, after which it should be removed.  Wrap patch in a tissue and discard in the trash. Wash hands thoroughly with soap and water. 2. You may remove the patch earlier than 72 hours if you experience unpleasant side effects which may include dry mouth, dizziness or visual disturbances. 3. Avoid touching the patch. Wash your hands with soap and water after contact with the patch.

## 2018-08-13 NOTE — Anesthesia Preprocedure Evaluation (Signed)
Anesthesia Evaluation  Patient identified by MRN, date of birth, ID band Patient awake    Reviewed: Allergy & Precautions, Patient's Chart, lab work & pertinent test results  Airway Mallampati: II  TM Distance: >3 FB Neck ROM: Full    Dental no notable dental hx. (+) Teeth Intact   Pulmonary asthma , former smoker,    Pulmonary exam normal breath sounds clear to auscultation       Cardiovascular negative cardio ROS Normal cardiovascular exam Rhythm:Regular Rate:Normal     Neuro/Psych negative neurological ROS  negative psych ROS   GI/Hepatic Neg liver ROS, GERD  Medicated and Controlled,Oral leukoplakia   Endo/Other  negative endocrine ROS  Renal/GU negative Renal ROS  negative genitourinary   Musculoskeletal negative musculoskeletal ROS (+)   Abdominal   Peds  Hematology negative hematology ROS (+)   Anesthesia Other Findings   Reproductive/Obstetrics                             Anesthesia Physical Anesthesia Plan  ASA: II  Anesthesia Plan: General   Post-op Pain Management:    Induction: Intravenous  PONV Risk Score and Plan: 4 or greater and Ondansetron, Dexamethasone, Treatment may vary due to age or medical condition and Scopolamine patch - Pre-op  Airway Management Planned: Oral ETT  Additional Equipment:   Intra-op Plan:   Post-operative Plan: Extubation in OR  Informed Consent: I have reviewed the patients History and Physical, chart, labs and discussed the procedure including the risks, benefits and alternatives for the proposed anesthesia with the patient or authorized representative who has indicated his/her understanding and acceptance.     Dental advisory given  Plan Discussed with: CRNA and Surgeon  Anesthesia Plan Comments:         Anesthesia Quick Evaluation

## 2018-08-13 NOTE — Interval H&P Note (Signed)
History and Physical Interval Note:  08/13/2018 7:45 AM  Meredith Stewart  has presented today for surgery, with the diagnosis of oral lesion  The various methods of treatment have been discussed with the patient and family. After consideration of risks, benefits and other options for treatment, the patient has consented to  Procedure(s): EXCISION/biopsy ORAL LESION WITH CO2 LASER ablation (N/A) as a surgical intervention .  The patient's history has been reviewed, patient examined, no change in status, stable for surgery.  I have reviewed the patient's chart and labs.  Questions were answered to the patient's satisfaction.     [name]

## 2018-08-13 NOTE — Anesthesia Postprocedure Evaluation (Signed)
Anesthesia Post Note  Patient: Meredith Stewart  Procedure(s) Performed: EXCISION/biopsy ORAL LESION WITH CO2 LASER ablation (N/A Mouth)     Patient location during evaluation: PACU Anesthesia Type: General Level of consciousness: awake and alert and oriented Pain management: pain level controlled Vital Signs Assessment: post-procedure vital signs reviewed and stable Respiratory status: spontaneous breathing, nonlabored ventilation and respiratory function stable Cardiovascular status: blood pressure returned to baseline and stable Postop Assessment: no apparent nausea or vomiting Anesthetic complications: no    Last Vitals:  Vitals:   08/13/18 0930 08/13/18 0945  BP: (!) 157/85 (!) 153/81  Pulse: 85 78  Resp: 18 13  Temp:    SpO2: 100% 99%    Last Pain:  Vitals:   08/13/18 0945  TempSrc:   PainSc: 3                  Marjarie Irion A.

## 2018-08-13 NOTE — Transfer of Care (Signed)
Immediate Anesthesia Transfer of Care Note  Patient: Meredith Stewart  Procedure(s) Performed: EXCISION/biopsy ORAL LESION WITH CO2 LASER ablation (N/A Mouth)  Patient Location: PACU  Anesthesia Type:General  Level of Consciousness: awake, alert  and oriented  Airway & Oxygen Therapy: Patient Spontanous Breathing and Patient connected to face mask oxygen  Post-op Assessment: Report given to RN and Post -op Vital signs reviewed and stable  Post vital signs: Reviewed and stable  Last Vitals:  Vitals Value Taken Time  BP 147/83 08/13/2018  8:57 AM  Temp    Pulse 60 08/13/2018  8:58 AM  Resp 18 08/13/2018  8:58 AM  SpO2 100 % 08/13/2018  8:58 AM  Vitals shown include unvalidated device data.  Last Pain:  Vitals:   08/13/18 0637  TempSrc: Oral  PainSc: 0-No pain         Complications: No apparent anesthesia complications

## 2018-08-13 NOTE — Op Note (Signed)
OPERATIVE REPORT  DATE OF SURGERY: 08/13/2018  PATIENT:  Meredith Stewart,  78 y.o. female  PRE-OPERATIVE DIAGNOSIS:  oral leukoplakia, history of oral cancer  POST-OPERATIVE DIAGNOSIS: Same  PROCEDURE:  Procedure(s): EXCISION/biopsy ORAL LESION WITH CO2 LASER ablation  SURGEON:  Beckie Salts, MD  ASSISTANTS: None  ANESTHESIA:   General   EBL: 20 ml  DRAINS: None  LOCAL MEDICATIONS USED: 1% Xylocaine with epinephrine  SPECIMEN: 1.  Anterior hard palate, 2.  Posterior right buccal mucosa, 3.  Anterior mandibular gingiva  COUNTS:  Correct  PROCEDURE DETAILS: The patient was taken to the operating room and placed on the operating table in the supine position. Following induction of general endotracheal anesthesia, patient was prepped and draped in standard fashion.  Saline soaked towels were placed around the face and saline soaked eye pads were used for laser protection as well.  A small rubber bite block was used throughout the case.  The oral cavity was inspected.  Significant hypertrophic mucosa and leukoplakia/erythroplakia was present in the anterior hard palate, posterior right buccal mucosa, and anterior mandibular gingival mucosa.  All of these areas were infiltrated with 1% Xylocaine with epinephrine.  A 4 mm punch biopsy was used in all 3 places to obtain a specimen.  These biopsy sites were cauterized with silver nitrate.  The carbon oxide laser was then used at a setting of 3 W continuous power to laser ablate all of the abnormal mucosal areas in these 3 sites.  Oral cavity was rinsed with saline and suctioned.  There is no additional bleeding.  No further pathology was identified.  The patient was awakened extubated and transferred to recovery in stable condition.    PATIENT DISPOSITION:  To PACU, stable

## 2018-08-14 ENCOUNTER — Encounter (HOSPITAL_BASED_OUTPATIENT_CLINIC_OR_DEPARTMENT_OTHER): Payer: Self-pay | Admitting: Otolaryngology

## 2018-08-24 DIAGNOSIS — Z681 Body mass index (BMI) 19 or less, adult: Secondary | ICD-10-CM | POA: Diagnosis not present

## 2018-08-24 DIAGNOSIS — J449 Chronic obstructive pulmonary disease, unspecified: Secondary | ICD-10-CM | POA: Diagnosis not present

## 2018-08-24 DIAGNOSIS — N8110 Cystocele, unspecified: Secondary | ICD-10-CM | POA: Diagnosis not present

## 2018-08-24 DIAGNOSIS — N952 Postmenopausal atrophic vaginitis: Secondary | ICD-10-CM | POA: Diagnosis not present

## 2018-08-24 DIAGNOSIS — Z87891 Personal history of nicotine dependence: Secondary | ICD-10-CM | POA: Diagnosis not present

## 2018-08-24 DIAGNOSIS — Z299 Encounter for prophylactic measures, unspecified: Secondary | ICD-10-CM | POA: Diagnosis not present

## 2018-08-24 DIAGNOSIS — J849 Interstitial pulmonary disease, unspecified: Secondary | ICD-10-CM | POA: Diagnosis not present

## 2018-09-03 DIAGNOSIS — N8110 Cystocele, unspecified: Secondary | ICD-10-CM | POA: Diagnosis not present

## 2018-09-03 DIAGNOSIS — N952 Postmenopausal atrophic vaginitis: Secondary | ICD-10-CM | POA: Diagnosis not present

## 2018-09-18 DIAGNOSIS — Z79899 Other long term (current) drug therapy: Secondary | ICD-10-CM | POA: Diagnosis not present

## 2018-09-18 DIAGNOSIS — E785 Hyperlipidemia, unspecified: Secondary | ICD-10-CM | POA: Diagnosis not present

## 2018-09-18 DIAGNOSIS — Z888 Allergy status to other drugs, medicaments and biological substances status: Secondary | ICD-10-CM | POA: Diagnosis not present

## 2018-09-18 DIAGNOSIS — Z85819 Personal history of malignant neoplasm of unspecified site of lip, oral cavity, and pharynx: Secondary | ICD-10-CM | POA: Diagnosis not present

## 2018-09-18 DIAGNOSIS — Z881 Allergy status to other antibiotic agents status: Secondary | ICD-10-CM | POA: Diagnosis not present

## 2018-09-18 DIAGNOSIS — Z85828 Personal history of other malignant neoplasm of skin: Secondary | ICD-10-CM | POA: Diagnosis not present

## 2018-09-18 DIAGNOSIS — J45909 Unspecified asthma, uncomplicated: Secondary | ICD-10-CM | POA: Diagnosis not present

## 2018-09-18 DIAGNOSIS — Z87891 Personal history of nicotine dependence: Secondary | ICD-10-CM | POA: Diagnosis not present

## 2018-09-18 DIAGNOSIS — Z882 Allergy status to sulfonamides status: Secondary | ICD-10-CM | POA: Diagnosis not present

## 2018-09-18 DIAGNOSIS — K219 Gastro-esophageal reflux disease without esophagitis: Secondary | ICD-10-CM | POA: Diagnosis not present

## 2018-09-18 DIAGNOSIS — F419 Anxiety disorder, unspecified: Secondary | ICD-10-CM | POA: Diagnosis not present

## 2018-09-18 DIAGNOSIS — N813 Complete uterovaginal prolapse: Secondary | ICD-10-CM | POA: Diagnosis not present

## 2018-09-19 DIAGNOSIS — J45909 Unspecified asthma, uncomplicated: Secondary | ICD-10-CM | POA: Diagnosis not present

## 2018-09-19 DIAGNOSIS — Z87891 Personal history of nicotine dependence: Secondary | ICD-10-CM | POA: Diagnosis not present

## 2018-09-19 DIAGNOSIS — N858 Other specified noninflammatory disorders of uterus: Secondary | ICD-10-CM | POA: Diagnosis not present

## 2018-09-19 DIAGNOSIS — N183 Chronic kidney disease, stage 3 (moderate): Secondary | ICD-10-CM | POA: Diagnosis not present

## 2018-09-19 DIAGNOSIS — Z79899 Other long term (current) drug therapy: Secondary | ICD-10-CM | POA: Diagnosis not present

## 2018-09-19 DIAGNOSIS — N813 Complete uterovaginal prolapse: Secondary | ICD-10-CM | POA: Diagnosis not present

## 2018-09-19 DIAGNOSIS — E785 Hyperlipidemia, unspecified: Secondary | ICD-10-CM | POA: Diagnosis not present

## 2018-09-19 DIAGNOSIS — K219 Gastro-esophageal reflux disease without esophagitis: Secondary | ICD-10-CM | POA: Diagnosis not present

## 2018-09-19 HISTORY — PX: ABDOMINAL HYSTERECTOMY: SHX81

## 2018-09-20 DIAGNOSIS — Z87891 Personal history of nicotine dependence: Secondary | ICD-10-CM | POA: Diagnosis not present

## 2018-09-20 DIAGNOSIS — E785 Hyperlipidemia, unspecified: Secondary | ICD-10-CM | POA: Diagnosis not present

## 2018-09-20 DIAGNOSIS — J45909 Unspecified asthma, uncomplicated: Secondary | ICD-10-CM | POA: Diagnosis not present

## 2018-09-20 DIAGNOSIS — N813 Complete uterovaginal prolapse: Secondary | ICD-10-CM | POA: Diagnosis not present

## 2018-09-20 DIAGNOSIS — Z79899 Other long term (current) drug therapy: Secondary | ICD-10-CM | POA: Diagnosis not present

## 2018-09-20 DIAGNOSIS — K219 Gastro-esophageal reflux disease without esophagitis: Secondary | ICD-10-CM | POA: Diagnosis not present

## 2018-10-01 DIAGNOSIS — J849 Interstitial pulmonary disease, unspecified: Secondary | ICD-10-CM | POA: Diagnosis not present

## 2018-10-01 DIAGNOSIS — D509 Iron deficiency anemia, unspecified: Secondary | ICD-10-CM | POA: Diagnosis not present

## 2018-10-01 DIAGNOSIS — Z299 Encounter for prophylactic measures, unspecified: Secondary | ICD-10-CM | POA: Diagnosis not present

## 2018-10-01 DIAGNOSIS — R5383 Other fatigue: Secondary | ICD-10-CM | POA: Diagnosis not present

## 2018-10-01 DIAGNOSIS — E7800 Pure hypercholesterolemia, unspecified: Secondary | ICD-10-CM | POA: Diagnosis not present

## 2018-10-01 DIAGNOSIS — R531 Weakness: Secondary | ICD-10-CM | POA: Diagnosis not present

## 2018-10-01 DIAGNOSIS — Z681 Body mass index (BMI) 19 or less, adult: Secondary | ICD-10-CM | POA: Diagnosis not present

## 2018-10-01 DIAGNOSIS — F411 Generalized anxiety disorder: Secondary | ICD-10-CM | POA: Diagnosis not present

## 2018-10-01 DIAGNOSIS — J449 Chronic obstructive pulmonary disease, unspecified: Secondary | ICD-10-CM | POA: Diagnosis not present

## 2018-10-24 DIAGNOSIS — M79672 Pain in left foot: Secondary | ICD-10-CM | POA: Diagnosis not present

## 2018-10-24 DIAGNOSIS — I739 Peripheral vascular disease, unspecified: Secondary | ICD-10-CM | POA: Diagnosis not present

## 2018-10-24 DIAGNOSIS — M79671 Pain in right foot: Secondary | ICD-10-CM | POA: Diagnosis not present

## 2018-10-24 DIAGNOSIS — L110 Acquired keratosis follicularis: Secondary | ICD-10-CM | POA: Diagnosis not present

## 2018-11-12 DIAGNOSIS — C44722 Squamous cell carcinoma of skin of right lower limb, including hip: Secondary | ICD-10-CM | POA: Diagnosis not present

## 2018-11-12 DIAGNOSIS — L438 Other lichen planus: Secondary | ICD-10-CM | POA: Diagnosis not present

## 2018-11-13 DIAGNOSIS — C069 Malignant neoplasm of mouth, unspecified: Secondary | ICD-10-CM | POA: Diagnosis not present

## 2018-11-15 ENCOUNTER — Other Ambulatory Visit: Payer: Self-pay

## 2018-11-16 NOTE — H&P (Signed)
Meredith Stewart is an 78 y.o. female.   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   Past Medical History:  Diagnosis Date  . Asthma   . GERD (gastroesophageal reflux disease)   . Hyperlipidemia   . MVP (mitral valve prolapse)   . 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 Tobacco abuse    smoked x 35 years, quit 12 years ago  . Wears glasses     Past Surgical History:  Procedure Laterality Date  . CARDIAC CATHETERIZATION     10/13  . DILATION AND CURETTAGE OF UTERUS    . EXCISION OF ORAL TUMOR  4/12   rt bucca-squamus cell ca  . EXCISION OF TONGUE LESION WITH LASER N/A 01/09/2013   Procedure: ORAL BIOPSY AND LASER ABLATION;  Surgeon: [name], MD;  Location: [place];  Service: ENT;  Laterality: N/A;  . EXCISION ORAL LESION WITH CO2 LASER Right 11/09/2015   Procedure: EXCISION ORAL LESION WITH CO2 LASER AND FROZEN SECTION ;  Surgeon: [name], MD;  Location: [place];  Service: ENT;  Laterality: Right;  . EXCISION ORAL LESION WITH CO2 LASER N/A 08/13/2018   Procedure: EXCISION/biopsy ORAL LESION WITH CO2 LASER ablation;  Surgeon: [name], MD;  Location: [place];  Service: ENT;  Laterality: N/A;  . EYE SURGERY     [facility] eye surgeries  . LEFT HEART CATHETERIZATION WITH CORONARY ANGIOGRAM N/A 05/18/2012   Procedure: LEFT HEART CATHETERIZATION WITH CORONARY ANGIOGRAM;  Surgeon: Josue Hector, MD;  Location: [facility] CATH LAB;  Service: Cardiovascular;  Laterality: N/A;    No family history on file. Social History:  reports that she quit smoking about 19 years ago. Her smoking use included cigarettes. She has a 35.00 pack-year smoking history. She has never used smokeless tobacco. She reports that she does not drink alcohol or use drugs.  Allergies:  Allergies  Allergen Reactions  . Erythromycin Hives  .  Nitroglycerin Other (See Comments)    Severe drop in b/p  . Sulfa Antibiotics     unknown    No medications prior to admission.    No results found for this or any previous visit (from the past 48 hour(s)). No results found.  ROS: otherwise negative  Height 5\' 6"  (1.676 m), weight 51.7 kg.  PHYSICAL EXAM: Overall appearance:  Healthy appearing, in no distress Head:  Normocephalic, atraumatic. Ears: External auditory canals are clear; tympanic membranes are intact and the middle ears are free of any effusion. Nose: External nose is healthy in appearance. Internal nasal exam free of any lesions or obstruction. Oral Cavity/pharynx:  Multiple verrucous and other abnormal mucosal lesions right posterior palate, buccal mucosa. Neuro:  No identifiable neurologic deficits. Neck: No palpable neck masses.  Studies Reviewed: none    Assessment/Plan Excision and biopsies, and laser excision of oral cancers.  [name] 11/16/2018, 1:50 PM

## 2018-11-19 ENCOUNTER — Encounter (HOSPITAL_COMMUNITY): Payer: Self-pay | Admitting: *Deleted

## 2018-11-19 ENCOUNTER — Other Ambulatory Visit: Payer: Self-pay

## 2018-11-19 NOTE — Progress Notes (Signed)
Spoke with pt for pre-op call. Pt states she has Mitral Valve prolapse, states her PCP watches this. States she has never seen a cardiologist. Pt states she does not have HTN or Diabetes. Pt does have macular degeneration in both eyes and cannot see to sign anything. She states she needs her niece to come and sign her in and sign the consent form for her. I told her that would be allowed, but her niece would have to leave after she signs the consent form. She voiced understanding.    Coronavirus Screening  Have you experienced the following symptoms:  Cough NO Fever (>100.60F) NO Runny nose NO Sore throat NO Difficulty breathing/shortness of breath  NO  Have you or a family member traveled in the last 14 days and where? NO    Pt notified that hospital visitation restrictions are in effect and the importance of the restrictions.

## 2018-11-19 NOTE — Pre-Procedure Instructions (Signed)
Left message on patient's voicemail that surgery had been moved from [facility] to [place], with a start time of 10a. Left message for patient to arrive at [facility] admitting department no later than 8a on 4/28 and to follow all other preoperative instructions given previously, including nothing to eat or drink after midnight tonight. Advised no visitors being allowed at [facility].

## 2018-11-20 ENCOUNTER — Ambulatory Visit (HOSPITAL_COMMUNITY): Payer: Medicare Other | Admitting: Certified Registered Nurse Anesthetist

## 2018-11-20 ENCOUNTER — Other Ambulatory Visit: Payer: Self-pay

## 2018-11-20 ENCOUNTER — Encounter (HOSPITAL_COMMUNITY)
Admission: RE | Disposition: A | Discharge status: Home / Self Care | Payer: Self-pay | Source: Home / Self Care | Attending: Otolaryngology

## 2018-11-20 ENCOUNTER — Ambulatory Visit (HOSPITAL_COMMUNITY)
Admission: RE | Admit: 2018-11-20 | Discharge: 2018-11-20 | Disposition: A | Discharge status: Home / Self Care | Payer: Medicare Other | Attending: Otolaryngology | Admitting: Otolaryngology

## 2018-11-20 ENCOUNTER — Encounter (HOSPITAL_COMMUNITY): Payer: Self-pay | Admitting: General Practice

## 2018-11-20 DIAGNOSIS — J449 Chronic obstructive pulmonary disease, unspecified: Secondary | ICD-10-CM | POA: Insufficient documentation

## 2018-11-20 DIAGNOSIS — D1039 Benign neoplasm of other parts of mouth: Secondary | ICD-10-CM | POA: Diagnosis not present

## 2018-11-20 DIAGNOSIS — C050 Malignant neoplasm of hard palate: Secondary | ICD-10-CM | POA: Diagnosis not present

## 2018-11-20 DIAGNOSIS — E785 Hyperlipidemia, unspecified: Secondary | ICD-10-CM | POA: Diagnosis not present

## 2018-11-20 DIAGNOSIS — C060 Malignant neoplasm of cheek mucosa: Secondary | ICD-10-CM | POA: Insufficient documentation

## 2018-11-20 DIAGNOSIS — R945 Abnormal results of liver function studies: Secondary | ICD-10-CM | POA: Diagnosis not present

## 2018-11-20 DIAGNOSIS — K219 Gastro-esophageal reflux disease without esophagitis: Secondary | ICD-10-CM | POA: Diagnosis not present

## 2018-11-20 DIAGNOSIS — Z87891 Personal history of nicotine dependence: Secondary | ICD-10-CM | POA: Insufficient documentation

## 2018-11-20 DIAGNOSIS — C069 Malignant neoplasm of mouth, unspecified: Secondary | ICD-10-CM | POA: Diagnosis not present

## 2018-11-20 HISTORY — DX: Cardiac murmur, unspecified: R01.1

## 2018-11-20 HISTORY — PX: EXCISION ORAL LESION WITH CO2 LASER: SHX6461

## 2018-11-20 LAB — CBC
HCT: 40.6 % (ref 36.0–46.0)
Hemoglobin: 13.2 g/dL (ref 12.0–15.0)
MCH: 28.8 pg (ref 26.0–34.0)
MCHC: 32.5 g/dL (ref 30.0–36.0)
MCV: 88.6 fL (ref 80.0–100.0)
Platelets: 242 10*3/uL (ref 150–400)
RBC: 4.58 MIL/uL (ref 3.87–5.11)
RDW: 18.9 % — ABNORMAL HIGH (ref 11.5–15.5)
WBC: 5 10*3/uL (ref 4.0–10.5)
nRBC: 0 % (ref 0.0–0.2)

## 2018-11-20 LAB — BASIC METABOLIC PANEL
Anion gap: 10 (ref 5–15)
BUN: 15 mg/dL (ref 8–23)
CO2: 26 mmol/L (ref 22–32)
Calcium: 9.8 mg/dL (ref 8.9–10.3)
Chloride: 97 mmol/L — ABNORMAL LOW (ref 98–111)
Creatinine, Ser: 0.8 mg/dL (ref 0.44–1.00)
GFR calc Af Amer: >60 mL/min (ref >60)
GFR calc non Af Amer: >60 mL/min (ref >60)
Glucose, Bld: 103 mg/dL — ABNORMAL HIGH (ref 70–99)
Potassium: 4.2 mmol/L (ref 3.5–5.1)
Sodium: 133 mmol/L — ABNORMAL LOW (ref 135–145)

## 2018-11-20 SURGERY — DESTRUCTION, LESION, MOUTH, USING CO2 LASER
Anesthesia: General

## 2018-11-20 MED ORDER — FENTANYL CITRATE (PF) 250 MCG/5ML IJ SOLN
INTRAMUSCULAR | Status: DC | PRN
  Administered 2018-11-20 (×2): 50 ug via INTRAVENOUS

## 2018-11-20 MED ORDER — PROPOFOL 10 MG/ML IV BOLUS
INTRAVENOUS | Status: DC | PRN
  Administered 2018-11-20: 100 mg via INTRAVENOUS
  Administered 2018-11-20: 50 mg via INTRAVENOUS

## 2018-11-20 MED ORDER — FENTANYL CITRATE (PF) 100 MCG/2ML IJ SOLN: 25.0000 ug | INTRAMUSCULAR | Status: DC | PRN

## 2018-11-20 MED ORDER — CLINDAMYCIN PHOSPHATE 600 MG/50ML IV SOLN
INTRAVENOUS | Status: DC | PRN
  Administered 2018-11-20: 600 mg via INTRAVENOUS

## 2018-11-20 MED ORDER — ROCURONIUM BROMIDE 10 MG/ML (PF) SYRINGE
PREFILLED_SYRINGE | INTRAVENOUS | Status: DC | PRN
  Administered 2018-11-20: 30 mg via INTRAVENOUS

## 2018-11-20 MED ORDER — ONDANSETRON HCL 4 MG/2ML IJ SOLN
INTRAMUSCULAR | Status: DC | PRN
  Administered 2018-11-20: 4 mg via INTRAVENOUS

## 2018-11-20 MED ORDER — DEXAMETHASONE SODIUM PHOSPHATE 10 MG/ML IJ SOLN
INTRAMUSCULAR | Status: DC | PRN
  Administered 2018-11-20: 10 mg via INTRAVENOUS

## 2018-11-20 MED ORDER — ROCURONIUM BROMIDE 50 MG/5ML IV SOSY
PREFILLED_SYRINGE | INTRAVENOUS | Status: AC
  Filled 2018-11-20: qty 5

## 2018-11-20 MED ORDER — CLINDAMYCIN HCL 300 MG PO CAPS: 300.0000 mg | ORAL_CAPSULE | Freq: Three times a day (TID) | ORAL | 0 refills | Status: DC

## 2018-11-20 MED ORDER — LACTATED RINGERS IV SOLN
INTRAVENOUS | Status: DC
  Administered 2018-11-20: 07:00:00 via INTRAVENOUS

## 2018-11-20 MED ORDER — ONDANSETRON HCL 4 MG/2ML IJ SOLN
INTRAMUSCULAR | Status: AC
  Filled 2018-11-20: qty 2

## 2018-11-20 MED ORDER — BACITRACIN ZINC 500 UNIT/GM EX OINT
TOPICAL_OINTMENT | CUTANEOUS | Status: AC
  Filled 2018-11-20: qty 28.35

## 2018-11-20 MED ORDER — 0.9 % SODIUM CHLORIDE (POUR BTL) OPTIME
TOPICAL | Status: DC | PRN
  Administered 2018-11-20: 09:00:00 1000 mL

## 2018-11-20 MED ORDER — DEXAMETHASONE SODIUM PHOSPHATE 10 MG/ML IJ SOLN
INTRAMUSCULAR | Status: AC
  Filled 2018-11-20: qty 1

## 2018-11-20 MED ORDER — PROMETHAZINE HCL 25 MG/ML IJ SOLN: 6.2500 mg | INTRAMUSCULAR | Status: DC | PRN

## 2018-11-20 MED ORDER — FENTANYL CITRATE (PF) 250 MCG/5ML IJ SOLN
INTRAMUSCULAR | Status: AC
  Filled 2018-11-20: qty 5

## 2018-11-20 MED ORDER — LIDOCAINE-EPINEPHRINE 1 %-1:100000 IJ SOLN
INTRAMUSCULAR | Status: AC
  Filled 2018-11-20: qty 1

## 2018-11-20 MED ORDER — CLINDAMYCIN PHOSPHATE 600 MG/50ML IV SOLN
INTRAVENOUS | Status: AC
  Filled 2018-11-20: qty 50

## 2018-11-20 MED ORDER — HYDROCODONE-ACETAMINOPHEN 7.5-325 MG PO TABS: 1.0000 | ORAL_TABLET | Freq: Four times a day (QID) | ORAL | 0 refills | Status: DC | PRN

## 2018-11-20 MED ORDER — LIDOCAINE 2% (20 MG/ML) 5 ML SYRINGE
INTRAMUSCULAR | Status: DC | PRN
  Administered 2018-11-20: 100 mg via INTRAVENOUS

## 2018-11-20 MED ORDER — PROPOFOL 10 MG/ML IV BOLUS
INTRAVENOUS | Status: AC
  Filled 2018-11-20: qty 20

## 2018-11-20 MED ORDER — LIDOCAINE-EPINEPHRINE 1 %-1:100000 IJ SOLN
INTRAMUSCULAR | Status: DC | PRN
  Administered 2018-11-20: 1.5 mL

## 2018-11-20 MED ORDER — LIDOCAINE 2% (20 MG/ML) 5 ML SYRINGE
INTRAMUSCULAR | Status: AC
  Filled 2018-11-20: qty 5

## 2018-11-20 MED ORDER — SUGAMMADEX SODIUM 200 MG/2ML IV SOLN
INTRAVENOUS | Status: DC | PRN
  Administered 2018-11-20: 200 mg via INTRAVENOUS

## 2018-11-20 MED ORDER — ACETAMINOPHEN 10 MG/ML IV SOLN: 1000.0000 mg | Freq: Once | INTRAVENOUS | Status: DC | PRN

## 2018-11-20 MED ORDER — SUCCINYLCHOLINE CHLORIDE 20 MG/ML IJ SOLN
INTRAMUSCULAR | Status: DC | PRN
  Administered 2018-11-20: 100 mg via INTRAVENOUS

## 2018-11-20 SURGICAL SUPPLY — 54 items
APPLIER CLIP 9.375 SM OPEN (CLIP)
APR CLP SM 9.3 20 MLT OPN (CLIP)
BLADE SURG 15 STRL LF DISP TIS (BLADE) IMPLANT
BLADE SURG 15 STRL SS (BLADE)
CANISTER SUCT 3000ML PPV (MISCELLANEOUS) ×3 IMPLANT
CLEANER TIP ELECTROSURG 2X2 (MISCELLANEOUS) ×3 IMPLANT
CLIP APPLIE 9.375 SM OPEN (CLIP) IMPLANT
COAGULATOR SUCT 6 FR SWTCH (ELECTROSURGICAL) ×1
COAGULATOR SUCT SWTCH 10FR 6 (ELECTROSURGICAL) ×1 IMPLANT
CONT SPEC 4OZ CLIKSEAL STRL BL (MISCELLANEOUS) IMPLANT
CORDS BIPOLAR (ELECTRODE) ×3 IMPLANT
COVER SURGICAL LIGHT HANDLE (MISCELLANEOUS) ×3 IMPLANT
COVER WAND RF STERILE (DRAPES) ×3 IMPLANT
DRAIN CHANNEL 15F RND FF W/TCR (WOUND CARE) IMPLANT
DRAIN SNY 10 ROU (WOUND CARE) IMPLANT
DRAPE HALF SHEET 40X57 (DRAPES) IMPLANT
DRAPE INCISE 23X17 IOBAN STRL (DRAPES)
DRAPE INCISE 23X17 STRL (DRAPES) IMPLANT
DRAPE INCISE IOBAN 23X17 STRL (DRAPES) IMPLANT
ELECT COATED BLADE 2.86 ST (ELECTRODE) ×3 IMPLANT
ELECT REM PT RETURN 9FT ADLT (ELECTROSURGICAL) ×3
ELECTRODE REM PT RTRN 9FT ADLT (ELECTROSURGICAL) ×1 IMPLANT
EVACUATOR SILICONE 100CC (DRAIN) ×3 IMPLANT
FORCEPS BIPOLAR SPETZLER 8 1.0 (NEUROSURGERY SUPPLIES) IMPLANT
GAUZE 4X4 16PLY RFD (DISPOSABLE) IMPLANT
GLOVE ECLIPSE 7.5 STRL STRAW (GLOVE) ×3 IMPLANT
GOWN STRL REUS W/ TWL LRG LVL3 (GOWN DISPOSABLE) ×2 IMPLANT
GOWN STRL REUS W/TWL LRG LVL3 (GOWN DISPOSABLE) ×6
KIT BASIN OR (CUSTOM PROCEDURE TRAY) ×3 IMPLANT
KIT TURNOVER KIT B (KITS) ×3 IMPLANT
LOCATOR NERVE 3 VOLT (DISPOSABLE) IMPLANT
NDL PRECISIONGLIDE 27X1.5 (NEEDLE) ×1 IMPLANT
NEEDLE PRECISIONGLIDE 27X1.5 (NEEDLE) ×3 IMPLANT
NS IRRIG 1000ML POUR BTL (IV SOLUTION) ×3 IMPLANT
PAD ARMBOARD 7.5X6 YLW CONV (MISCELLANEOUS) ×6 IMPLANT
PENCIL FOOT CONTROL (ELECTRODE) ×3 IMPLANT
SPECIMEN JAR MEDIUM (MISCELLANEOUS) IMPLANT
SPONGE INTESTINAL PEANUT (DISPOSABLE) IMPLANT
SPONGE LAP 18X18 RF (DISPOSABLE) IMPLANT
STAPLER VISISTAT 35W (STAPLE) ×3 IMPLANT
SUT CHROMIC 3 0 SH 27 (SUTURE) IMPLANT
SUT CHROMIC 5 0 P 3 (SUTURE) IMPLANT
SUT ETHILON 3 0 PS 1 (SUTURE) IMPLANT
SUT ETHILON 5 0 PS 2 18 (SUTURE) IMPLANT
SUT SILK 2 0 REEL (SUTURE) IMPLANT
SUT SILK 2 0 SH (SUTURE) ×2 IMPLANT
SUT SILK 3 0 SH CR/8 (SUTURE) IMPLANT
SUT SILK 4 0 REEL (SUTURE) IMPLANT
SUT VIC AB 3-0 SH 18 (SUTURE) IMPLANT
TOWEL OR 17X24 6PK STRL BLUE (TOWEL DISPOSABLE) ×3 IMPLANT
TRAY ENT MC OR (CUSTOM PROCEDURE TRAY) ×3 IMPLANT
TRAY FOLEY MTR SLVR 14FR STAT (SET/KITS/TRAYS/PACK) IMPLANT
TUBE FEEDING 10FR FLEXIFLO (MISCELLANEOUS) IMPLANT
WATER STERILE IRR 1000ML POUR (IV SOLUTION) IMPLANT

## 2018-11-20 NOTE — Transfer of Care (Signed)
Immediate Anesthesia Transfer of Care Note  Patient: Meredith Stewart  Procedure(s) Performed: EXCISION ORAL LESION WITH CO2 LASER (N/A )  Patient Location: PACU  Anesthesia Type:General  Level of Consciousness: awake, alert  and oriented  Airway & Oxygen Therapy: Patient Spontanous Breathing and Patient connected to face mask oxygen  Post-op Assessment: Report given to RN and Post -op Vital signs reviewed and stable  Post vital signs: Reviewed and stable  Last Vitals:  Vitals Value Taken Time  BP 119/71 11/20/2018 10:38 AM  Temp 36.4 C 11/20/2018 10:39 AM  Pulse 83 11/20/2018 10:43 AM  Resp 13 11/20/2018 10:43 AM  SpO2 100 % 11/20/2018 10:43 AM  Vitals shown include unvalidated device data.  Last Pain:  Vitals:   11/20/18 0714  TempSrc:   PainSc: 3       Patients Stated Pain Goal: 3 (13/64/38 3779)  Complications: No apparent anesthesia complications

## 2018-11-20 NOTE — Anesthesia Preprocedure Evaluation (Signed)
Anesthesia Evaluation  Patient identified by MRN, date of birth, ID band Patient awake    Reviewed: Allergy & Precautions, NPO status , Patient's Chart, lab work & pertinent test results  Airway Mallampati: II  TM Distance: >3 FB Neck ROM: Full    Dental no notable dental hx.    Pulmonary COPD,  COPD inhaler, former smoker,    Pulmonary exam normal breath sounds clear to auscultation       Cardiovascular negative cardio ROS Normal cardiovascular exam Rhythm:Regular Rate:Normal     Neuro/Psych negative neurological ROS  negative psych ROS   GI/Hepatic Neg liver ROS, GERD  ,  Endo/Other  negative endocrine ROS  Renal/GU negative Renal ROS  negative genitourinary   Musculoskeletal negative musculoskeletal ROS (+)   Abdominal   Peds negative pediatric ROS (+)  Hematology negative hematology ROS (+)   Anesthesia Other Findings   Reproductive/Obstetrics negative OB ROS                            Anesthesia Physical Anesthesia Plan  ASA: III  Anesthesia Plan: General   Post-op Pain Management:    Induction: Intravenous and Rapid sequence  PONV Risk Score and Plan: 3 and Ondansetron, Dexamethasone and Treatment may vary due to age or medical condition  Airway Management Planned: Oral ETT  Additional Equipment:   Intra-op Plan:   Post-operative Plan: Extubation in OR  Informed Consent: I have reviewed the patients History and Physical, chart, labs and discussed the procedure including the risks, benefits and alternatives for the proposed anesthesia with the patient or authorized representative who has indicated his/her understanding and acceptance.     Dental advisory given  Plan Discussed with: CRNA and Surgeon  Anesthesia Plan Comments:         Anesthesia Quick Evaluation

## 2018-11-20 NOTE — Op Note (Signed)
OPERATIVE REPORT  DATE OF SURGERY: 11/20/2018  PATIENT:  Meredith Stewart,  78 y.o. female  PRE-OPERATIVE DIAGNOSIS:  Verrucous carcinoma  POST-OPERATIVE DIAGNOSIS:  Verrucous carcinoma  PROCEDURE:  Procedure(s): EXCISION ORAL LESION WITH CO2 LASER  SURGEON:  Beckie Salts, MD  ASSISTANTS: None  ANESTHESIA:   General   EBL: 100 ml  DRAINS: None  LOCAL MEDICATIONS USED: 1% Xylocaine with epinephrine  SPECIMEN: 1.  Right postero-superior buccal mass, 2.  Right hard palate mass.  COUNTS:  Correct  PROCEDURE DETAILS: The patient was taken to the operating room and placed on the operating table in the supine position. Following induction of general endotracheal anesthesia, the face was draped in a standard fashion with saline soaked towels and saline soaked eye pads for laser protection.  A rubber bite block was used on the left side throughout the case.  2 major lesions were identified.    The first was the verrucous and papillary posterior buccal lesion.  This was removed in its entirety using electrocautery dissection.  The specimen was irregular in shape and the greatest dimension of the specimen was approximately 3 cm.  Sutures were used for orientation purposes.  Suction cautery was used for hemostasis.  The hard palate lesion where the verrucous carcinoma was identified previously was then also excised in its entirety using electrocautery.  Dissection was continued down to the hard palate bone and into the musculature of the soft palate.  This lesion was also removed in its entirety and oriented with sutures for pathology.  Additional lesions were taken from the anterior, medial, and posterior mucosal margins.  These were all sent for frozen section analysis and were all free of carcinoma.  The medial margin was not evaluated since the medial margin was contiguous with the lateral margin of the first specimen.  Suction cautery was used on this area as well for hemostasis.  There  were 3 lesions that were lasered with the CO2 laser, handpiece.  3 W continuous power was used.  The first was the small papillary type lesion along the right lateral mandible lateral to the canine tooth.  The second was adjacent to the posteromedial margin of the hard palate resection, and the third was the anterior hard palate.  The superficial lesions were ablated with the laser.  The oral cavity and pharynx were suctioned of blood and secretions and irrigated with saline.  Saline soaked 4 x 4's were packed into the wounds temporarily during the later part of the procedure.  Good hemostasis was achieved.  Patient was then awakened extubated and transferred to recovery in stable condition.      PATIENT DISPOSITION:  To PACU, stable

## 2018-11-20 NOTE — Interval H&P Note (Signed)
History and Physical Interval Note:  11/20/2018 8:44 AM  Meredith Stewart  has presented today for surgery, with the diagnosis of Verrucous carcinoma.  The various methods of treatment have been discussed with the patient and family. After consideration of risks, benefits and other options for treatment, the patient has consented to  Procedure(s): EXCISION ORAL LESION WITH CO2 LASER, multiple biopsies Frozen sectio (N/A) as a surgical intervention.  The patient's history has been reviewed, patient examined, no change in status, stable for surgery.  I have reviewed the patient's chart and labs.  Questions were answered to the patient's satisfaction.     [name]

## 2018-11-20 NOTE — Anesthesia Procedure Notes (Addendum)
Procedure Name: Intubation Performed by: Milford Cage, CRNA Pre-anesthesia Checklist: Patient identified, Emergency Drugs available, Suction available and Patient being monitored Patient Re-evaluated:Patient Re-evaluated prior to induction Oxygen Delivery Method: Circle System Utilized Preoxygenation: Pre-oxygenation with 100% oxygen Induction Type: IV induction and Rapid sequence Laryngoscope Size: Mac and 3 Grade View: Grade I Tube type: Oral Laser Tube: Laser Tube and Cuffed inflated with minimal occlusive pressure - saline Tube size: 6.0 mm Number of attempts: 1 Airway Equipment and Method: Stylet and Oral airway Placement Confirmation: ETT inserted through vocal cords under direct vision,  positive ETCO2 and breath sounds checked- equal and bilateral Tube secured with: Tape Dental Injury: Teeth and Oropharynx as per pre-operative assessment

## 2018-11-20 NOTE — Discharge Instructions (Signed)
Stay on a liquid diet.  Rinse mouth with salt water 4 times daily.  Brush teeth as you normally do.

## 2018-11-20 NOTE — Anesthesia Postprocedure Evaluation (Signed)
Anesthesia Post Note  Patient: Meredith Stewart  Procedure(s) Performed: EXCISION ORAL LESION WITH CO2 LASER (N/A )     Patient location during evaluation: PACU Anesthesia Type: General Level of consciousness: awake and alert Pain management: pain level controlled Vital Signs Assessment: post-procedure vital signs reviewed and stable Respiratory status: spontaneous breathing, nonlabored ventilation, respiratory function stable and patient connected to nasal cannula oxygen Cardiovascular status: blood pressure returned to baseline and stable Postop Assessment: no apparent nausea or vomiting Anesthetic complications: no    Last Vitals:  Vitals:   11/20/18 1136 11/20/18 1137  BP: (!) 144/80   Pulse: 76 73  Resp: 11 11  Temp:  (!) 36.2 C  SpO2: 96% 97%    Last Pain:  Vitals:   11/20/18 0714  TempSrc:   PainSc: 3                  Ivelis Norgard S

## 2018-11-21 ENCOUNTER — Emergency Department (HOSPITAL_COMMUNITY)
Admission: EM | Admit: 2018-11-21 | Discharge: 2018-11-21 | Disposition: A | Discharge status: Home / Self Care | Payer: Medicare Other | Attending: Emergency Medicine | Admitting: Emergency Medicine

## 2018-11-21 ENCOUNTER — Other Ambulatory Visit: Payer: Self-pay

## 2018-11-21 ENCOUNTER — Encounter (HOSPITAL_COMMUNITY): Payer: Self-pay | Admitting: Otolaryngology

## 2018-11-21 DIAGNOSIS — Z98818 Other dental procedure status: Secondary | ICD-10-CM | POA: Diagnosis not present

## 2018-11-21 DIAGNOSIS — Z7982 Long term (current) use of aspirin: Secondary | ICD-10-CM | POA: Insufficient documentation

## 2018-11-21 DIAGNOSIS — Z87891 Personal history of nicotine dependence: Secondary | ICD-10-CM | POA: Insufficient documentation

## 2018-11-21 DIAGNOSIS — Z79899 Other long term (current) drug therapy: Secondary | ICD-10-CM | POA: Diagnosis not present

## 2018-11-21 DIAGNOSIS — J45909 Unspecified asthma, uncomplicated: Secondary | ICD-10-CM | POA: Diagnosis not present

## 2018-11-21 DIAGNOSIS — Z7951 Long term (current) use of inhaled steroids: Secondary | ICD-10-CM | POA: Diagnosis not present

## 2018-11-21 DIAGNOSIS — R041 Hemorrhage from throat: Secondary | ICD-10-CM | POA: Diagnosis not present

## 2018-11-21 DIAGNOSIS — K91840 Postprocedural hemorrhage and hematoma of a digestive system organ or structure following a digestive system procedure: Secondary | ICD-10-CM | POA: Diagnosis not present

## 2018-11-21 DIAGNOSIS — Z882 Allergy status to sulfonamides status: Secondary | ICD-10-CM | POA: Diagnosis not present

## 2018-11-21 DIAGNOSIS — J95830 Postprocedural hemorrhage and hematoma of a respiratory system organ or structure following a respiratory system procedure: Secondary | ICD-10-CM | POA: Diagnosis not present

## 2018-11-21 DIAGNOSIS — Z881 Allergy status to other antibiotic agents status: Secondary | ICD-10-CM | POA: Diagnosis not present

## 2018-11-21 DIAGNOSIS — Z888 Allergy status to other drugs, medicaments and biological substances status: Secondary | ICD-10-CM | POA: Diagnosis not present

## 2018-11-21 MED ORDER — SODIUM CHLORIDE 0.9 % IV BOLUS: 1000.0000 mL | Freq: Once | INTRAVENOUS | Status: DC

## 2018-11-21 NOTE — ED Provider Notes (Signed)
[place] EMERGENCY DEPARTMENT Provider Note   CSN: [id number] Arrival date & time: 11/21/18  1222    History   Chief Complaint Chief Complaint  Patient presents with  . Post-op Problem    HPI Meredith Stewart is a 78 y.o. female presenting today for postoperative bleeding.  Patient had excision of carcinoma via CO2 laser performed by Dr. Constance Holster yesterday.  Patient reports that she had onset of oral bleeding today at 8 AM associated with a mild aching sensation constant without aggravating or alleviating factors.  Patient was seen at [facility] urgency department and they were unable to stop the bleeding, they consulted Dr. Blenda Nicely who advised transfer to our emergency department.  Basic blood work was drawn without evidence of anemia or leukocytosis.  Patient arrives well-appearing and in no acute distress with gauze packing in place without active bleedthrough.     HPI  Past Medical History:  Diagnosis Date  . Asthma   . GERD (gastroesophageal reflux disease)   . Heart murmur   . Hyperlipidemia   . MVP (mitral valve prolapse)   . 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 Tobacco abuse    smoked x 35 years, quit 12 years ago  . Wears glasses     Patient Active Problem List   Diagnosis Date Noted  . Hyperlipidemia 05/18/2012  . Chest pain, mid sternal 05/18/2012  . Abnormal LFTs 05/18/2012  . GERD (gastroesophageal reflux disease)   . Tobacco abuse     Past Surgical History:  Procedure Laterality Date  . ABDOMINAL HYSTERECTOMY  09/19/2018  . CARDIAC CATHETERIZATION     10/13  . DILATION AND CURETTAGE OF UTERUS    . EXCISION OF ORAL TUMOR  4/12   rt bucca-squamus cell ca  . EXCISION OF TONGUE LESION WITH LASER N/A 01/09/2013   Procedure: ORAL BIOPSY AND LASER ABLATION;  Surgeon: [name], MD;  Location: [place];  Service: ENT;  Laterality: N/A;  . EXCISION ORAL LESION WITH CO2 LASER Right 11/09/2015   Procedure:  EXCISION ORAL LESION WITH CO2 LASER AND FROZEN SECTION ;  Surgeon: [name], MD;  Location: [place];  Service: ENT;  Laterality: Right;  . EXCISION ORAL LESION WITH CO2 LASER N/A 08/13/2018   Procedure: EXCISION/biopsy ORAL LESION WITH CO2 LASER ablation;  Surgeon: [name], MD;  Location: [place];  Service: ENT;  Laterality: N/A;  . EXCISION ORAL LESION WITH CO2 LASER N/A 11/20/2018   Procedure: EXCISION ORAL LESION WITH CO2 LASER;  Surgeon: [name], MD;  Location: [place];  Service: ENT;  Laterality: N/A;  . EYE SURGERY     [facility] eye surgeries  . LEFT HEART CATHETERIZATION WITH CORONARY ANGIOGRAM N/A 05/18/2012   Procedure: LEFT HEART CATHETERIZATION WITH CORONARY ANGIOGRAM;  Surgeon: Josue Hector, MD;  Location: [facility] CATH LAB;  Service: Cardiovascular;  Laterality: N/A;     OB History   No obstetric history on file.      Home Medications    Prior to Admission medications   Medication Sig Start Date End Date Taking? Authorizing Provider  albuterol (PROVENTIL HFA;VENTOLIN HFA) 108 (90 BASE) MCG/ACT inhaler Inhale 1 puff into the lungs See admin instructions. Inhale 1 puff daily, may inhale 1 puff every 4 to 6 hours as needed for shortness of breath    [provider]  aspirin 325 MG tablet Take 325 mg by mouth daily as needed for moderate pain.  [provider]  calcium carbonate (OSCAL) 1500 (600 Ca) MG TABS tablet Take 600 mg of elemental calcium by mouth daily.    [provider]  Cholecalciferol (VITAMIN D) 2000 UNITS tablet Take 2,000 Units by mouth daily with supper.     [provider]  clindamycin (CLEOCIN) 300 MG capsule Take 1 capsule (300 mg total) by mouth 3 (three) times daily. 11/20/18   [name], MD  clonazePAM (KLONOPIN) 0.5 MG tablet Take 0.25 mg by mouth See admin instructions. Take 0.25 mg by mouth at night, may take an additional 0.25 mg as needed for anxiety 07/26/18   [provider]  diphenhydrAMINE (BENADRYL) 25 MG tablet Take 25 mg by mouth 2 (two) times a day.    [provider]  fluocinonide ointment (LIDEX) 9.41 % Apply 1 application topically See admin instructions. Apply a thin layer to oral lesions 4 - 6 times daily as needed for mouth sores    [provider]  Fluticasone-Salmeterol (ADVAIR) 250-50 MCG/DOSE AEPB Inhale 1 puff into the lungs See admin instructions. Inhale 1 puff daily, may inhale a second dose as needed for shortness of breath    [provider]  HYDROcodone-acetaminophen (NORCO) 7.5-325 MG tablet Take 1 tablet by mouth every 6 (six) hours as needed for moderate pain. 11/20/18   [name], MD  HYDROcodone-acetaminophen (NORCO/VICODIN) 5-325 MG tablet Take 0.25 tablets by mouth 2 (two) times daily as needed for moderate pain.    [provider]  multivitamin-lutein (OCUVITE-LUTEIN) CAPS Take 1 capsule by mouth every evening.     [provider]  pantoprazole (PROTONIX) 40 MG tablet Take 40 mg by mouth daily.    [provider]  pravastatin (PRAVACHOL) 20 MG tablet Take 20 mg by mouth daily with supper.     [provider]    Family History No family history on file.  Social History Social History   Tobacco Use  . Smoking status: Former Smoker    Packs/day: 1.00    Years: 35.00    Pack years: 35.00    Types: Cigarettes    Last attempt to quit: 07/26/1999    Years since quitting: 19.3  . Smokeless tobacco: Never Used  Substance Use Topics  . Alcohol use: No  . Drug use: No     Allergies   Erythromycin; Nitroglycerin; and Sulfa antibiotics   Review of Systems Review of Systems  Constitutional: Negative.  Negative for chills, fatigue and fever.  HENT: Positive for mouth sores (Postoperative bleeding). Negative for facial swelling, sore throat, trouble swallowing and voice change.   Respiratory: Negative.  Negative for cough and shortness of breath.    Cardiovascular: Negative.  Negative for chest pain.  Musculoskeletal: Negative.  Negative for arthralgias and myalgias.  Neurological: Negative.  Negative for dizziness, weakness, light-headedness and headaches.  All other systems reviewed and are negative.  Physical Exam Updated Vital Signs BP 121/69   Pulse 84   Temp 98 F (36.7 C)   Resp 15   Ht 5\' 6"  (1.676 m)   Wt 51.7 kg   SpO2 99%   BMI 18.40 kg/m   Physical Exam Constitutional:      General: She is not in acute distress.    Appearance: Normal appearance. She is well-developed. She is not ill-appearing or diaphoretic.  HENT:     Head: Normocephalic and atraumatic.     Jaw: There is normal jaw occlusion. No trismus.     Right Ear: External  ear normal.     Left Ear: External ear normal.     Nose: Nose normal.     Mouth/Throat:     Lips: Pink.     Mouth: Mucous membranes are moist.     Comments: Gauze packing in place was not removed as ENT en route.  Airway patent. Eyes:     General: Vision grossly intact. Gaze aligned appropriately.     Conjunctiva/sclera: Conjunctivae normal.     Pupils: Pupils are equal, round, and reactive to light.  Neck:     Musculoskeletal: Normal range of motion and neck supple.     Trachea: Trachea and phonation normal. No tracheal tenderness or tracheal deviation.  Pulmonary:     Effort: Pulmonary effort is normal. No respiratory distress.  Abdominal:     General: There is no distension.     Palpations: Abdomen is soft.     Tenderness: There is no abdominal tenderness. There is no guarding or rebound.  Musculoskeletal: Normal range of motion.  Skin:    General: Skin is warm and dry.  Neurological:     Mental Status: She is alert.     GCS: GCS eye subscore is 4. GCS verbal subscore is 5. GCS motor subscore is 6.     Comments: Speech is clear and goal oriented, follows commands Major Cranial nerves without deficit, no facial droop Moves extremities without ataxia, coordination intact  Normal gait without assistance or difficulty  Psychiatric:        Behavior: Behavior normal.      ED Treatments / Results  Labs (all labs ordered are listed, but only abnormal results are displayed) Labs Reviewed - No data to display  EKG None  Radiology No results found.  Procedures Procedures (including critical care time)  Medications Ordered in ED Medications - No data to display   Initial Impression / Assessment and Plan / ED Course  I have reviewed the triage vital signs and the nursing notes.  Pertinent labs & imaging results that were available during my care of the patient were reviewed by me and considered in my medical decision making (see chart for details).  Clinical Course as of Nov 21 1523  Wed Nov 21, 2018  1329 Discussed case with Dr. Blenda Nicely who is coming to see patient in ED.   [BM]    Clinical Course User Index [BM] Deliah Boston, PA-C   Discussed case with Dr. Gilford Raid who has also seen and evaluated the patient.  No indication for repeat blood work at this time plan to consult ENT, Dr. Wellington Hampshire. - Discussed case with Dr. Wellington Hampshire who is coming to see patient in ED. - Patient seen and evaluated by Dr. Blenda Nicely who has controlled bleeding here in emergency department please see her note for further details. - Case rediscussed with Dr. Gilford Raid who agrees with discharge and outpatient follow-up at this time.  At this time there does not appear to be any evidence of an acute emergency medical condition and the patient appears stable for discharge with appropriate outpatient follow up. Diagnosis was discussed with patient who verbalizes understanding of care plan and is agreeable to discharge. I have discussed return precautions with patient who verbalizes understanding of return precautions. Patient encouraged to follow-up with their PCP and ENT. All questions answered.  Patient was discharged in good condition.  Note: Portions of this  report may have been transcribed using voice recognition software. Every effort was made to ensure accuracy; however, inadvertent computerized transcription  errors may still be present. Final Clinical Impressions(s) / ED Diagnoses   Final diagnoses:  Postprocedural hemorrhage due to complication of oral surgery    ED Discharge Orders    None       Gari Crown 11/21/18 1527    Isla Pence, MD 11/21/18 1555

## 2018-11-21 NOTE — Discharge Instructions (Addendum)
You have been diagnosed today with Postoperative Bleeding.  At this time there does not appear to be the presence of an emergent medical condition, however there is always the potential for conditions to change. Please read and follow the below instructions.  Please return to the Emergency Department immediately for any new or worsening symptoms. Please be sure to follow up with your Primary Care Provider within one week regarding your visit today; please call their office to schedule an appointment even if you are feeling better for a follow-up visit. As discussed please follow-up with your oral surgeon Dr. Constance Holster for postoperative checkup.  Return to the emergency department immediately if: You began to bleed again Feel lightheaded, dizzy chest pain or shortness of breath Any new/concerning or worsening symptoms   Please read the additional information packets attached to your discharge summary.  Do not take your medicine if  develop an itchy rash, swelling in your mouth or lips, or difficulty breathing.

## 2018-11-21 NOTE — Consult Note (Signed)
OTOLARYNGOLOGY CONSULTATION   Primary Care Physician: Monico Blitz, MD Patient Location at Initial Consult: Emergency Department Chief Complaint/Reason for Consult: oral bleeding  History of Presenting Illness:  History obtained from the patient. Meredith Stewart is a  78 y.o. female presenting with bleeding from the oral cavity.  This began at approximately 8:00 this morning.  She is a patient of my partner, Dr. Constance Holster.  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  She did well overnight but woke suddenly with bleeding from her mouth this morning.  She was unsure what was coming from.  I spoke to the patient's niece on the phone at home and suggested ice cubes as well as holding pressure the lesion.  [facility] [place] emergency department.  Topical TXA was attempted.  Patient did have some further bleeding upon removal of gauze from her mouth.  Her mouth was then packed with gauze again and the patient was transferred here.  Gauze have made in place but have stayed dry.  They have been in place for several hours.  She is not currently bleeding.  Gauze removed with no active bleeding noted.  She had a normal hemoglobin.  The patient is requesting to go home.  She will have some assistance at home from family.  She does not take any blood thinners.  Her blood pressure has been normal.  Past Medical History:  Diagnosis Date  . Asthma   . GERD (gastroesophageal reflux disease)   . Heart murmur   . Hyperlipidemia   . MVP (mitral valve prolapse)   . 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 Tobacco abuse    smoked x 35 years, quit 12 years ago  . Wears glasses     Past Surgical History:  Procedure Laterality Date  . ABDOMINAL HYSTERECTOMY  09/19/2018  . CARDIAC CATHETERIZATION     10/13  . DILATION AND CURETTAGE OF UTERUS    . EXCISION OF ORAL TUMOR  4/12   rt bucca-squamus cell ca  . EXCISION OF  TONGUE LESION WITH LASER N/A 01/09/2013   Procedure: ORAL BIOPSY AND LASER ABLATION;  Surgeon: [name], MD;  Location: [place];  Service: ENT;  Laterality: N/A;  . EXCISION ORAL LESION WITH CO2 LASER Right 11/09/2015   Procedure: EXCISION ORAL LESION WITH CO2 LASER AND FROZEN SECTION ;  Surgeon: [name], MD;  Location: [place];  Service: ENT;  Laterality: Right;  . EXCISION ORAL LESION WITH CO2 LASER N/A 08/13/2018   Procedure: EXCISION/biopsy ORAL LESION WITH CO2 LASER ablation;  Surgeon: [name], MD;  Location: [place];  Service: ENT;  Laterality: N/A;  . EXCISION ORAL LESION WITH CO2 LASER N/A 11/20/2018   Procedure: EXCISION ORAL LESION WITH CO2 LASER;  Surgeon: [name], MD;  Location: [place];  Service: ENT;  Laterality: N/A;  . EYE SURGERY     lazer eye surgeries  . LEFT HEART CATHETERIZATION WITH CORONARY ANGIOGRAM N/A 05/18/2012   Procedure: LEFT HEART CATHETERIZATION WITH CORONARY ANGIOGRAM;  Surgeon: Josue Hector, MD;  Location: [facility] CATH LAB;  Service: Cardiovascular;  Laterality: N/A;    No family history on file.  Social History   Socioeconomic History  . Marital status: Widowed    Spouse name: Not on file  . Number of children: Not on file  . Years of education: Not on file  .  Highest education level: Not on file  Occupational History  . Not on file  Social Needs  . Financial resource strain: Not on file  . Food insecurity:    Worry: Not on file    Inability: Not on file  . Transportation needs:    Medical: Not on file    Non-medical: Not on file  Tobacco Use  . Smoking status: Former Smoker    Packs/day: 1.00    Years: 35.00    Pack years: 35.00    Types: Cigarettes    Last attempt to quit: 07/26/1999    Years since quitting: 19.3  . Smokeless tobacco: Never Used  Substance and Sexual Activity  . Alcohol use: No  . Drug use: No  . Sexual activity: Not on file  Lifestyle  . Physical activity:     Days per week: Not on file    Minutes per session: Not on file  . Stress: Not on file  Relationships  . Social connections:    Talks on phone: Not on file    Gets together: Not on file    Attends religious service: Not on file    Active member of club or organization: Not on file    Attends meetings of clubs or organizations: Not on file    Relationship status: Not on file  Other Topics Concern  . Not on file  Social History Narrative  . Not on file    No current facility-administered medications on file prior to encounter.    Current Outpatient Medications on File Prior to Encounter  Medication Sig Dispense Refill  . albuterol (PROVENTIL HFA;VENTOLIN HFA) 108 (90 BASE) MCG/ACT inhaler Inhale 1 puff into the lungs See admin instructions. Inhale 1 puff daily, may inhale 1 puff every 4 to 6 hours as needed for shortness of breath    . aspirin 325 MG tablet Take 325 mg by mouth daily as needed for moderate pain.    . calcium carbonate (OSCAL) 1500 (600 Ca) MG TABS tablet Take 600 mg of elemental calcium by mouth daily.    . Cholecalciferol (VITAMIN D) 2000 UNITS tablet Take 2,000 Units by mouth daily with supper.     . clindamycin (CLEOCIN) 300 MG capsule Take 1 capsule (300 mg total) by mouth 3 (three) times daily. 15 capsule 0  . clonazePAM (KLONOPIN) 0.5 MG tablet Take 0.25 mg by mouth See admin instructions. Take 0.25 mg by mouth at night, may take an additional 0.25 mg as needed for anxiety    . diphenhydrAMINE (BENADRYL) 25 MG tablet Take 25 mg by mouth 2 (two) times a day.    . fluocinonide ointment (LIDEX) 0.62 % Apply 1 application topically See admin instructions. Apply a thin layer to oral lesions 4 - 6 times daily as needed for mouth sores    . Fluticasone-Salmeterol (ADVAIR) 250-50 MCG/DOSE AEPB Inhale 1 puff into the lungs See admin instructions. Inhale 1 puff daily, may inhale a second dose as needed for shortness of breath    . HYDROcodone-acetaminophen (NORCO) 7.5-325  MG tablet Take 1 tablet by mouth every 6 (six) hours as needed for moderate pain. 20 tablet 0  . HYDROcodone-acetaminophen (NORCO/VICODIN) 5-325 MG tablet Take 0.25 tablets by mouth 2 (two) times daily as needed for moderate pain.    . multivitamin-lutein (OCUVITE-LUTEIN) CAPS Take 1 capsule by mouth every evening.     . pantoprazole (PROTONIX) 40 MG tablet Take 40 mg by mouth daily.    . pravastatin (PRAVACHOL) 20  MG tablet Take 20 mg by mouth daily with supper.       Allergies  Allergen Reactions  . Erythromycin Hives  . Nitroglycerin Other (See Comments)    Severe drop in b/p  . Sulfa Antibiotics     unknown     Review of Systems: Completed and negative except for the above   OBJECTIVE: Vital Signs: Vitals:   11/21/18 1227  BP: (!) 91/55  Pulse: 91  Resp: 15  Temp: 98.2 F (36.8 C)  SpO2: 96%    I&O No intake or output data in the 24 hours ending 11/21/18 1459  Physical Exam General: Well developed, well nourished. No acute distress. Voice strong no dysphonia  Head/Face: Normocephalic, atraumatic. No scars or lesions. No sinus tenderness. Facial nerve intact and equal bilaterally.  No facial lacerations. Salivary glands non tender and without palpable masses  Eyes: Globes well positioned, no proptosis Lids: No periorbital edema/ecchymosis. No lid laceration Conjunctiva: No chemosis, hemorrhage PERRLA.  Small bruise at right medial canthus.  Ears: No gross deformity. Normal external canal. Tympanic membrane intact and without fluid bilaterally  Hearing:  Normal speech reception.  Nose: No gross deformity or lesions. No purulent discharge. Septum midline. No turbinate hypertrophy.  Mouth/Oropharynx: Lips without any lesions. Dentition fair.  Roots of midline mandibular incisors are exposed from prior anterior CO2 laser mucosal excision.  No bleeding here.  In the right hard soft junction there is a large surgical wound.  There is no current bleeding though there are some  mild small prominent blood vessels anteriorly. No mucosal lesions within the oropharynx. No tonsillar enlargement, exudate, or lesions. Pharyngeal walls symmetrical. Uvula midline. Tongue midline without lesions.  Neck: Trachea midline. No masses. No thyromegaly or nodules palpated. No crepitus.  Lymphatic: No lymphadenopathy in the neck.  Respiratory: No stridor or distress.  Cardiovascular: Regular rate and rhythm.  Extremities: No edema or cyanosis. Warm and well-perfused.  Skin: No scars or lesions on face or neck.  Neurologic: CN II-XII intact. Moving all extremities without gross abnormality.  Other:      Labs: Lab Results  Component Value Date   WBC 5.0 11/20/2018   HGB 13.2 11/20/2018   HCT 40.6 11/20/2018   PLT 242 11/20/2018   CHOL 221 (H) 05/17/2012   TRIG 84 05/17/2012   HDL 70 05/17/2012   ALT 432 (H) 05/17/2012   AST 233 (H) 05/17/2012   NA 133 (L) 11/20/2018   K 4.2 11/20/2018   CL 97 (L) 11/20/2018   CREATININE 0.80 11/20/2018   BUN 15 11/20/2018   CO2 26 11/20/2018   TSH 0.452 05/17/2012   INR 0.96 05/17/2012   HGBA1C 5.5 05/17/2012     Review of Ancillary Data / Diagnostic Tests: Labs reviewed.  Hemoglobin 13  ASSESSMENT/PLAN:  78 y.o. 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  This appears to be pretty well controlled.  A small amount of bleeding postoperatively is not unexpected.  I was able to place a little bit of Surg[place] in the wound bed today for some added reassurance.  I offered to keep her overnight.  However, the patient declines and prefers to go home stating that she does have some help at home.  I demonstrated where to hold pressure if she has any further bleeding.  I have advised cold beverages.  If bleeding is uncontrolled she should return to the emergency department.  If this is  during business hours 8-12 Monday through Friday, we are happy to see her  in clinic of course.  She should follow-up with Dr. Constance Holster scheduled.  Gavin Pound, MD  [facility] [place] Office phone [phone]

## 2018-11-21 NOTE — ED Triage Notes (Signed)
Pt states she had oral surgery yesterday and it has been " bleeding a lot " ; pt unable to say how much it was been bleeding ; denies any trouble breathing

## 2018-11-23 ENCOUNTER — Encounter (HOSPITAL_COMMUNITY)
Admission: EM | Disposition: A | Discharge status: Home / Self Care | Payer: Self-pay | Source: Home / Self Care | Attending: Emergency Medicine

## 2018-11-23 ENCOUNTER — Encounter (HOSPITAL_COMMUNITY): Payer: Self-pay | Admitting: Emergency Medicine

## 2018-11-23 ENCOUNTER — Other Ambulatory Visit: Payer: Self-pay

## 2018-11-23 ENCOUNTER — Observation Stay (HOSPITAL_COMMUNITY)
Admission: EM | Admit: 2018-11-23 | Discharge: 2018-11-23 | Disposition: A | Discharge status: Home / Self Care | Payer: Medicare Other | Attending: Otolaryngology | Admitting: Otolaryngology

## 2018-11-23 ENCOUNTER — Observation Stay (HOSPITAL_COMMUNITY): Payer: Medicare Other | Admitting: Anesthesiology

## 2018-11-23 DIAGNOSIS — L7622 Postprocedural hemorrhage and hematoma of skin and subcutaneous tissue following other procedure: Secondary | ICD-10-CM | POA: Diagnosis not present

## 2018-11-23 DIAGNOSIS — Z881 Allergy status to other antibiotic agents status: Secondary | ICD-10-CM | POA: Insufficient documentation

## 2018-11-23 DIAGNOSIS — R0489 Hemorrhage from other sites in respiratory passages: Secondary | ICD-10-CM | POA: Diagnosis not present

## 2018-11-23 DIAGNOSIS — C060 Malignant neoplasm of cheek mucosa: Secondary | ICD-10-CM | POA: Insufficient documentation

## 2018-11-23 DIAGNOSIS — J45909 Unspecified asthma, uncomplicated: Secondary | ICD-10-CM | POA: Diagnosis not present

## 2018-11-23 DIAGNOSIS — Z7951 Long term (current) use of inhaled steroids: Secondary | ICD-10-CM | POA: Diagnosis not present

## 2018-11-23 DIAGNOSIS — E785 Hyperlipidemia, unspecified: Secondary | ICD-10-CM | POA: Diagnosis not present

## 2018-11-23 DIAGNOSIS — Y838 Other surgical procedures as the cause of abnormal reaction of the patient, or of later complication, without mention of misadventure at the time of the procedure: Secondary | ICD-10-CM | POA: Insufficient documentation

## 2018-11-23 DIAGNOSIS — K219 Gastro-esophageal reflux disease without esophagitis: Secondary | ICD-10-CM | POA: Diagnosis not present

## 2018-11-23 DIAGNOSIS — R079 Chest pain, unspecified: Secondary | ICD-10-CM | POA: Diagnosis not present

## 2018-11-23 DIAGNOSIS — Z882 Allergy status to sulfonamides status: Secondary | ICD-10-CM | POA: Insufficient documentation

## 2018-11-23 DIAGNOSIS — Z87891 Personal history of nicotine dependence: Secondary | ICD-10-CM | POA: Insufficient documentation

## 2018-11-23 DIAGNOSIS — Z79899 Other long term (current) drug therapy: Secondary | ICD-10-CM | POA: Diagnosis not present

## 2018-11-23 DIAGNOSIS — Z7982 Long term (current) use of aspirin: Secondary | ICD-10-CM | POA: Insufficient documentation

## 2018-11-23 DIAGNOSIS — K1379 Other lesions of oral mucosa: Secondary | ICD-10-CM | POA: Diagnosis not present

## 2018-11-23 DIAGNOSIS — F419 Anxiety disorder, unspecified: Secondary | ICD-10-CM | POA: Insufficient documentation

## 2018-11-23 DIAGNOSIS — K91840 Postprocedural hemorrhage and hematoma of a digestive system organ or structure following a digestive system procedure: Principal | ICD-10-CM | POA: Insufficient documentation

## 2018-11-23 DIAGNOSIS — J9589 Other postprocedural complications and disorders of respiratory system, not elsewhere classified: Secondary | ICD-10-CM | POA: Diagnosis not present

## 2018-11-23 DIAGNOSIS — I341 Nonrheumatic mitral (valve) prolapse: Secondary | ICD-10-CM | POA: Diagnosis not present

## 2018-11-23 DIAGNOSIS — C4482 Squamous cell carcinoma of overlapping sites of skin: Secondary | ICD-10-CM | POA: Diagnosis not present

## 2018-11-23 HISTORY — PX: SKIN FULL THICKNESS GRAFT: SHX442

## 2018-11-23 LAB — CBC
HCT: 31.8 % — ABNORMAL LOW (ref 36.0–46.0)
Hemoglobin: 10.4 g/dL — ABNORMAL LOW (ref 12.0–15.0)
MCH: 29.1 pg (ref 26.0–34.0)
MCHC: 32.7 g/dL (ref 30.0–36.0)
MCV: 89.1 fL (ref 80.0–100.0)
Platelets: 232 10*3/uL (ref 150–400)
RBC: 3.57 MIL/uL — ABNORMAL LOW (ref 3.87–5.11)
RDW: 18.5 % — ABNORMAL HIGH (ref 11.5–15.5)
WBC: 8.8 10*3/uL (ref 4.0–10.5)
nRBC: 0 % (ref 0.0–0.2)

## 2018-11-23 LAB — BASIC METABOLIC PANEL
Anion gap: 12 (ref 5–15)
BUN: 19 mg/dL (ref 8–23)
CO2: 26 mmol/L (ref 22–32)
Calcium: 9.6 mg/dL (ref 8.9–10.3)
Chloride: 98 mmol/L (ref 98–111)
Creatinine, Ser: 0.76 mg/dL (ref 0.44–1.00)
GFR calc Af Amer: >60 mL/min (ref >60)
GFR calc non Af Amer: >60 mL/min (ref >60)
Glucose, Bld: 119 mg/dL — ABNORMAL HIGH (ref 70–99)
Potassium: 4.1 mmol/L (ref 3.5–5.1)
Sodium: 136 mmol/L (ref 135–145)

## 2018-11-23 LAB — APTT: aPTT: 27 seconds (ref 24–36)

## 2018-11-23 LAB — PROTIME-INR
INR: 1 (ref 0.8–1.2)
Prothrombin Time: 13.3 seconds (ref 11.4–15.2)

## 2018-11-23 SURGERY — APPLICATION, GRAFT, SKIN, FULL-THICKNESS
Anesthesia: General | Site: Thigh | Laterality: Right

## 2018-11-23 MED ORDER — FENTANYL CITRATE (PF) 250 MCG/5ML IJ SOLN
INTRAMUSCULAR | Status: AC
  Filled 2018-11-23: qty 5

## 2018-11-23 MED ORDER — LACTATED RINGERS IV SOLN
INTRAVENOUS | Status: DC | PRN
  Administered 2018-11-23 (×2): via INTRAVENOUS

## 2018-11-23 MED ORDER — FENTANYL CITRATE (PF) 100 MCG/2ML IJ SOLN
INTRAMUSCULAR | Status: AC
  Administered 2018-11-23: 25 ug via INTRAVENOUS
  Filled 2018-11-23: qty 2

## 2018-11-23 MED ORDER — 0.9 % SODIUM CHLORIDE (POUR BTL) OPTIME
TOPICAL | Status: DC | PRN
  Administered 2018-11-23: 1000 mL

## 2018-11-23 MED ORDER — HYDROCODONE-ACETAMINOPHEN 7.5-325 MG PO TABS: 1.0000 | ORAL_TABLET | Freq: Four times a day (QID) | ORAL | 0 refills | Status: DC | PRN

## 2018-11-23 MED ORDER — ONDANSETRON HCL 4 MG/2ML IJ SOLN
INTRAMUSCULAR | Status: AC
  Filled 2018-11-23: qty 2

## 2018-11-23 MED ORDER — MIDAZOLAM HCL 2 MG/2ML IJ SOLN
INTRAMUSCULAR | Status: AC
  Filled 2018-11-23: qty 2

## 2018-11-23 MED ORDER — CLINDAMYCIN PHOSPHATE 600 MG/50ML IV SOLN
INTRAVENOUS | Status: DC | PRN
  Administered 2018-11-23: 600 mg via INTRAVENOUS

## 2018-11-23 MED ORDER — PHENYLEPHRINE HCL (PRESSORS) 10 MG/ML IV SOLN
INTRAVENOUS | Status: DC | PRN
  Administered 2018-11-23 (×2): 120 ug via INTRAVENOUS
  Administered 2018-11-23: 40 ug via INTRAVENOUS
  Administered 2018-11-23: 120 ug via INTRAVENOUS

## 2018-11-23 MED ORDER — CLINDAMYCIN HCL 300 MG PO CAPS: 300.0000 mg | ORAL_CAPSULE | Freq: Three times a day (TID) | ORAL | 0 refills | Status: DC

## 2018-11-23 MED ORDER — SUCCINYLCHOLINE CHLORIDE 200 MG/10ML IV SOSY
PREFILLED_SYRINGE | INTRAVENOUS | Status: AC
  Filled 2018-11-23: qty 10

## 2018-11-23 MED ORDER — GLYCOPYRROLATE 0.2 MG/ML IJ SOLN
INTRAMUSCULAR | Status: DC | PRN
  Administered 2018-11-23: 0.2 mg via INTRAVENOUS

## 2018-11-23 MED ORDER — FENTANYL CITRATE (PF) 100 MCG/2ML IJ SOLN: 50.0000 ug | Freq: Once | INTRAMUSCULAR | Status: DC

## 2018-11-23 MED ORDER — BACITRACIN ZINC 500 UNIT/GM EX OINT: TOPICAL_OINTMENT | CUTANEOUS | Status: DC | PRN

## 2018-11-23 MED ORDER — LIDOCAINE HCL (CARDIAC) PF 100 MG/5ML IV SOSY
PREFILLED_SYRINGE | INTRAVENOUS | Status: DC | PRN
  Administered 2018-11-23: 60 mg via INTRATRACHEAL

## 2018-11-23 MED ORDER — EPINEPHRINE PF 1 MG/ML IJ SOLN
INTRAMUSCULAR | Status: AC
  Filled 2018-11-23: qty 1

## 2018-11-23 MED ORDER — LIDOCAINE 2% (20 MG/ML) 5 ML SYRINGE
INTRAMUSCULAR | Status: AC
  Filled 2018-11-23: qty 5

## 2018-11-23 MED ORDER — BACITRACIN ZINC 500 UNIT/GM EX OINT
TOPICAL_OINTMENT | CUTANEOUS | Status: AC
  Filled 2018-11-23: qty 28.35

## 2018-11-23 MED ORDER — TRANEXAMIC ACID 1000 MG/10ML IV SOLN
500.0000 mg | Freq: Once | INTRAVENOUS | Status: AC
  Administered 2018-11-23: 06:00:00 500 mg via TOPICAL
  Filled 2018-11-23: qty 10

## 2018-11-23 MED ORDER — DEXAMETHASONE SODIUM PHOSPHATE 10 MG/ML IJ SOLN
INTRAMUSCULAR | Status: DC | PRN
  Administered 2018-11-23: 5 mg via INTRAVENOUS

## 2018-11-23 MED ORDER — ONDANSETRON HCL 4 MG/2ML IJ SOLN
INTRAMUSCULAR | Status: DC | PRN
  Administered 2018-11-23: 4 mg via INTRAVENOUS

## 2018-11-23 MED ORDER — POTASSIUM CHLORIDE IN NACL 20-0.45 MEQ/L-% IV SOLN
INTRAVENOUS | Status: DC
  Administered 2018-11-23: 10:00:00 via INTRAVENOUS
  Filled 2018-11-23 (×3): qty 1000

## 2018-11-23 MED ORDER — FENTANYL CITRATE (PF) 100 MCG/2ML IJ SOLN
25.0000 ug | INTRAMUSCULAR | Status: DC | PRN
  Administered 2018-11-23 (×2): 25 ug via INTRAVENOUS

## 2018-11-23 MED ORDER — FENTANYL CITRATE (PF) 250 MCG/5ML IJ SOLN
INTRAMUSCULAR | Status: DC | PRN
  Administered 2018-11-23 (×2): 50 ug via INTRAVENOUS

## 2018-11-23 MED ORDER — MINERAL OIL LIGHT 100 % EX OIL
TOPICAL_OIL | CUTANEOUS | Status: DC | PRN
  Administered 2018-11-23: 1 via TOPICAL

## 2018-11-23 MED ORDER — CLINDAMYCIN PHOSPHATE 600 MG/50ML IV SOLN
INTRAVENOUS | Status: AC
  Filled 2018-11-23: qty 50

## 2018-11-23 MED ORDER — ONDANSETRON HCL 4 MG/2ML IJ SOLN: 4.0000 mg | Freq: Once | INTRAMUSCULAR | Status: DC

## 2018-11-23 MED ORDER — SUCCINYLCHOLINE CHLORIDE 20 MG/ML IJ SOLN
INTRAMUSCULAR | Status: DC | PRN
  Administered 2018-11-23: 100 mg via INTRAVENOUS

## 2018-11-23 MED ORDER — PHENYLEPHRINE 40 MCG/ML (10ML) SYRINGE FOR IV PUSH (FOR BLOOD PRESSURE SUPPORT)
PREFILLED_SYRINGE | INTRAVENOUS | Status: AC
  Filled 2018-11-23: qty 10

## 2018-11-23 MED ORDER — ALBUTEROL SULFATE HFA 108 (90 BASE) MCG/ACT IN AERS
2.0000 | INHALATION_SPRAY | Freq: Once | RESPIRATORY_TRACT | Status: AC
  Administered 2018-11-23: 05:00:00 2 via RESPIRATORY_TRACT
  Filled 2018-11-23: qty 6.7

## 2018-11-23 MED ORDER — LIDOCAINE-EPINEPHRINE 1 %-1:100000 IJ SOLN
INTRAMUSCULAR | Status: AC
  Filled 2018-11-23: qty 1

## 2018-11-23 MED ORDER — GLYCOPYRROLATE PF 0.2 MG/ML IJ SOSY
PREFILLED_SYRINGE | INTRAMUSCULAR | Status: AC
  Filled 2018-11-23: qty 1

## 2018-11-23 MED ORDER — SODIUM CHLORIDE 0.9 % IV SOLN
INTRAVENOUS | Status: DC | PRN
  Administered 2018-11-23: 07:00:00 25 ug/min via INTRAVENOUS

## 2018-11-23 MED ORDER — DEXAMETHASONE SODIUM PHOSPHATE 10 MG/ML IJ SOLN
INTRAMUSCULAR | Status: AC
  Filled 2018-11-23: qty 1

## 2018-11-23 MED ORDER — PROPOFOL 10 MG/ML IV BOLUS
INTRAVENOUS | Status: AC
  Filled 2018-11-23: qty 40

## 2018-11-23 MED ORDER — PROPOFOL 10 MG/ML IV BOLUS
INTRAVENOUS | Status: DC | PRN
  Administered 2018-11-23: 20 mg via INTRAVENOUS
  Administered 2018-11-23: 30 mg via INTRAVENOUS
  Administered 2018-11-23: 130 mg via INTRAVENOUS

## 2018-11-23 SURGICAL SUPPLY — 38 items
BALL CTTN LRG ABS STRL LF (GAUZE/BANDAGES/DRESSINGS) ×2
BLADE DERMATOME SS (BLADE) ×4 IMPLANT
BLADE SURG 15 STRL LF DISP TIS (BLADE) IMPLANT
BLADE SURG 15 STRL SS (BLADE)
BNDG GAUZE ELAST 4 BULKY (GAUZE/BANDAGES/DRESSINGS) ×3 IMPLANT
CANISTER SUCT 3000ML PPV (MISCELLANEOUS) ×4 IMPLANT
COAGULATOR SUCT 6 FR SWTCH (ELECTROSURGICAL) ×1
COAGULATOR SUCT SWTCH 10FR 6 (ELECTROSURGICAL) ×2 IMPLANT
COTTONBALL LRG STERILE PKG (GAUZE/BANDAGES/DRESSINGS) ×4 IMPLANT
COVER MAYO STAND STRL (DRAPES) ×3 IMPLANT
COVER SURGICAL LIGHT HANDLE (MISCELLANEOUS) ×4 IMPLANT
COVER WAND RF STERILE (DRAPES) ×4 IMPLANT
DERMACARRIERS GRAFT 1 TO 1.5 (DISPOSABLE) ×4
DRAPE HALF SHEET 40X57 (DRAPES) ×4 IMPLANT
DRAPE MICROSCOPE LEICA 46X105 (MISCELLANEOUS) ×4 IMPLANT
DRSG OPSITE 6X11 MED (GAUZE/BANDAGES/DRESSINGS) ×2 IMPLANT
ELECT COATED BLADE 2.86 ST (ELECTRODE) ×4 IMPLANT
GAUZE 4X4 16PLY RFD (DISPOSABLE) ×4 IMPLANT
GAUZE SPONGE 4X4 12PLY STRL (GAUZE/BANDAGES/DRESSINGS) ×3 IMPLANT
GAUZE XEROFORM 5X9 LF (GAUZE/BANDAGES/DRESSINGS) ×3 IMPLANT
GLOVE ECLIPSE 7.5 STRL STRAW (GLOVE) ×4 IMPLANT
GOWN BRE IMP SLV AUR LG STRL (GOWN DISPOSABLE) ×8 IMPLANT
GRAFT DERMACARRIERS 1 TO 1.5 (DISPOSABLE) IMPLANT
KIT BASIN OR (CUSTOM PROCEDURE TRAY) ×4 IMPLANT
KIT TURNOVER KIT B (KITS) ×4 IMPLANT
NDL PRECISIONGLIDE 27X1.5 (NEEDLE) ×2 IMPLANT
NEEDLE PRECISIONGLIDE 27X1.5 (NEEDLE) ×4 IMPLANT
NS IRRIG 1000ML POUR BTL (IV SOLUTION) ×4 IMPLANT
PAD ARMBOARD 7.5X6 YLW CONV (MISCELLANEOUS) ×8 IMPLANT
PENCIL FOOT CONTROL (ELECTRODE) ×4 IMPLANT
SUT CHROMIC 4 0 PS 2 18 (SUTURE) ×4 IMPLANT
SUT SILK 2 0SH CR/8 30 (SUTURE) ×3 IMPLANT
SYR BULB 3OZ (MISCELLANEOUS) ×4 IMPLANT
SYR CONTROL 10ML LL (SYRINGE) ×4 IMPLANT
TRAY ENT MC OR (CUSTOM PROCEDURE TRAY) ×4 IMPLANT
TUBE CONNECTING 12'X1/4 (SUCTIONS) ×1
TUBE CONNECTING 12X1/4 (SUCTIONS) ×3 IMPLANT
TUBE SALEM SUMP 16 FR W/ARV (TUBING) ×2 IMPLANT

## 2018-11-23 NOTE — ED Notes (Signed)
Pt's Brother waiting in [place]- [phone]

## 2018-11-23 NOTE — ED Triage Notes (Signed)
Patient is transfer from [facility], had oral surgery on 4/28 to have a spot removed on roof of mouth.  Patient was seen on 4/29 here and seen at [place] last night.  States that she has been bleeding a lot per patient.  Hgb has dropped from 12 to 10.

## 2018-11-23 NOTE — Anesthesia Procedure Notes (Signed)
Procedure Name: Intubation Date/Time: 11/23/2018 7:01 AM Performed by: Clovis Cao, CRNA Pre-anesthesia Checklist: Patient identified, Emergency Drugs available, Patient being monitored, Suction available and Timeout performed Patient Re-evaluated:Patient Re-evaluated prior to induction Oxygen Delivery Method: Circle system utilized Preoxygenation: Pre-oxygenation with 100% oxygen (guaze removed from tongue prior to induction) Induction Type: IV induction, Cricoid Pressure applied and Rapid sequence Laryngoscope Size: Miller and 2 Grade View: Grade I Tube type: Oral Tube size: 6.0 mm Number of attempts: 1 Airway Equipment and Method: Stylet Placement Confirmation: ETT inserted through vocal cords under direct vision,  positive ETCO2 and breath sounds checked- equal and bilateral Secured at: 22 cm Tube secured with: Tape Dental Injury: Teeth and Oropharynx as per pre-operative assessment

## 2018-11-23 NOTE — Op Note (Addendum)
OPERATIVE REPORT  DATE OF SURGERY: 11/23/2018  PATIENT:  Meredith Stewart,  78 y.o. female  PRE-OPERATIVE DIAGNOSIS:  bleeding  POST-OPERATIVE DIAGNOSIS:  bleeding  PROCEDURE:  Procedure(s): SKIN GRAFT FULL THICKNESS EXAM UNDER ANESTHESIA  SURGEON:  Beckie Salts, MD  ASSISTANTS: None  ANESTHESIA:   General   EBL: 20 ml  DRAINS: None  LOCAL MEDICATIONS USED:  None  SPECIMEN:  none  COUNTS:  Correct  PROCEDURE DETAILS: The patient was taken to the operating room and placed on the operating table in the supine position. Following induction of general endotracheal anesthesia, the face/mouth and the right thigh were prepped and draped in the standard fashion. 1.  Examination under anesthesia with treatment of postoperative hemorrhage.  A bite block was used on the left side of the mouth.  The oral cavity was inspected.  Blood clot was evacuated from the surgical defect.  Suction cautery was used to cauterize a couple of spots that were oozing.  Tissues were granulating nicely otherwise.  No signs of infection.  Adequate hemostasis was achieved.  2.  Split-thickness skin graft.  A dermatome was used to harvest a skin graft, split thickness from the right thigh.  The graft was meshed 1.5: 1.  The graft was then brought into the oral defect and secured in place using interrupted 4-0 chromic sutures.  Additional skin was trimmed off. The total graft size and defect size were approximately 7 x 3 cm.  The skin graft was then secured in place using a large piece of Xeroform which was then secured in place using 2-0 silk sutures creating a bolster going from side to side and front to back.  This also applied some pressure on the wound.  The oral cavity and pharynx were irrigated with saline and suctioned.  An orogastric tube was used to aspirate the contents of the stomach.  The donor site was dressed with an OpSite 4 x 4 gauze and a Kerlix dressing.  Patient was awakened extubated and transferred  to recovery in stable condition.    PATIENT DISPOSITION:  To PACU, stable

## 2018-11-23 NOTE — Discharge Instructions (Signed)
Liquid diet only.  Call immediately if there is any additional bleeding.  Continue oral antibiotics, new prescription is sent in.  We will remove the dressing and stitches on follow-up appointment next Thursday.  Keep the leg wrapped up, clean and dry.  Change the dressing if it gets soiled otherwise just keep it in place.

## 2018-11-23 NOTE — Care Management Obs Status (Signed)
[place] NOTIFICATION   Patient Details  Name: Meredith Stewart MRN: [id number] Date of Birth: 06-Feb-1941   Medicare Observation Status Notification Given:  Yes    Marilu Favre, RN 11/23/2018, 1:53 PM

## 2018-11-23 NOTE — Anesthesia Preprocedure Evaluation (Signed)
Anesthesia Evaluation  Patient identified by MRN, date of birth, ID band Patient awake    Reviewed: Allergy & Precautions, NPO status , Patient's Chart, lab work & pertinent test results  Airway Mallampati: II  TM Distance: >3 FB     Dental   Pulmonary asthma , former smoker,    breath sounds clear to auscultation       Cardiovascular negative cardio ROS   Rhythm:Regular Rate:Normal     Neuro/Psych    GI/Hepatic Neg liver ROS, GERD  ,  Endo/Other  negative endocrine ROS  Renal/GU negative Renal ROS     Musculoskeletal   Abdominal   Peds  Hematology   Anesthesia Other Findings   Reproductive/Obstetrics                             Anesthesia Physical Anesthesia Plan  ASA: III  Anesthesia Plan:    Post-op Pain Management:    Induction:   PONV Risk Score and Plan: 2 and Ondansetron and Dexamethasone  Airway Management Planned: Oral ETT  Additional Equipment:   Intra-op Plan:   Post-operative Plan: Possible Post-op intubation/ventilation  Informed Consent: I have reviewed the patients History and Physical, chart, labs and discussed the procedure including the risks, benefits and alternatives for the proposed anesthesia with the patient or authorized representative who has indicated his/her understanding and acceptance.     Dental advisory given  Plan Discussed with: CRNA, Surgeon and Anesthesiologist  Anesthesia Plan Comments:         Anesthesia Quick Evaluation

## 2018-11-23 NOTE — Transfer of Care (Signed)
Immediate Anesthesia Transfer of Care Note  Patient: Meredith Stewart  Procedure(s) Performed: SKIN GRAFT FULL THICKNESS (Right Thigh) EXAM UNDER ANESTHESIA (N/A )  Patient Location:     Anesthesia Type:General  Level of Consciousness: awake, alert , oriented and sedated  Airway & Oxygen Therapy: Patient Spontanous Breathing and Patient connected to face mask oxygen  Post-op Assessment: Report given to RN, Post -op Vital signs reviewed and stable and Patient moving all extremities  Post vital signs: Reviewed and stable  Last Vitals:  Vitals Value Taken Time  BP 115/69 11/23/2018  8:22 AM  Temp 36.3 C 11/23/2018  8:22 AM  Pulse 98 11/23/2018  8:22 AM  Resp 14 11/23/2018  8:23 AM  SpO2 93 % 11/23/2018  8:22 AM  Vitals shown include unvalidated device data.  Last Pain:  Vitals:   11/23/18 0439  TempSrc:   PainSc: 0-No pain         Complications: No apparent anesthesia complications

## 2018-11-23 NOTE — Anesthesia Postprocedure Evaluation (Signed)
Anesthesia Post Note  Patient: Meredith Stewart  Procedure(s) Performed: SKIN GRAFT FULL THICKNESS (Right Thigh) EXAM UNDER ANESTHESIA (N/A )     Patient location during evaluation: PACU Anesthesia Type: General Level of consciousness: sedated Pain management: pain level controlled Vital Signs Assessment: post-procedure vital signs reviewed and stable Respiratory status: spontaneous breathing and respiratory function stable Cardiovascular status: stable Postop Assessment: no apparent nausea or vomiting Anesthetic complications: no    Last Vitals:  Vitals:   11/23/18 0913 11/23/18 0923  BP: 119/64 125/68  Pulse: 85 84  Resp: 15 13  Temp: (!) 36.3 C 36.5 C  SpO2: 95% 99%    Last Pain:  Vitals:   11/23/18 0923  TempSrc: Oral  PainSc:                  Kaylib Furness DANIEL

## 2018-11-23 NOTE — Progress Notes (Signed)
Discharged pt to home, tolerated fluids orally, denies pain, no evidence of oral bleeding,  alert, oriented and stable. Instructions given and explained, concerns were addressed accordingly and belongings were returned.

## 2018-11-23 NOTE — ED Notes (Signed)
Full set of labs collected from pt

## 2018-11-23 NOTE — ED Provider Notes (Addendum)
[place] EMERGENCY DEPARTMENT Provider Note   CSN: [id number] Arrival date & time: 11/23/18  0430    History   Chief Complaint Chief Complaint  Patient presents with  . Coagulation Disorder    HPI Meredith Stewart is a 78 y.o. 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 Constance Holster on 11/20/2018 who presents today for evaluation of bleeding.  She is transferred here from [facility] rocking him for Dr. Constance Holster to see.  She reports that at this time she is not having any bleeding.  She was seen in the ER last night for this, was reportedly offered admission according to chart review however declined.  According to labs reviewed from [facility] rocking him her hemoglobin dropped approximately 2 g since yesterday.      HPI  Past Medical History:  Diagnosis Date  . Asthma   . GERD (gastroesophageal reflux disease)   . Heart murmur   . Hyperlipidemia   . MVP (mitral valve prolapse)   . 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 Tobacco abuse    smoked x 35 years, quit 12 years ago  . Wears glasses     Patient Active Problem List   Diagnosis Date Noted  . Oral hemorrhage 11/23/2018  . Hyperlipidemia 05/18/2012  . Chest pain, mid sternal 05/18/2012  . Abnormal LFTs 05/18/2012  . GERD (gastroesophageal reflux disease)   . Tobacco abuse     Past Surgical History:  Procedure Laterality Date  . ABDOMINAL HYSTERECTOMY  09/19/2018  . CARDIAC CATHETERIZATION     10/13  . DILATION AND CURETTAGE OF UTERUS    . EXCISION OF ORAL TUMOR  4/12   rt bucca-squamus cell ca  . EXCISION OF TONGUE LESION WITH LASER N/A 01/09/2013   Procedure: ORAL BIOPSY AND LASER ABLATION;  Surgeon: [name], MD;  Location: [place];  Service: ENT;  Laterality: N/A;  . EXCISION ORAL LESION WITH CO2 LASER Right 11/09/2015   Procedure: EXCISION ORAL LESION WITH CO2 LASER AND  FROZEN SECTION ;  Surgeon: [name], MD;  Location: [place];  Service: ENT;  Laterality: Right;  . EXCISION ORAL LESION WITH CO2 LASER N/A 08/13/2018   Procedure: EXCISION/biopsy ORAL LESION WITH CO2 LASER ablation;  Surgeon: [name], MD;  Location: [place];  Service: ENT;  Laterality: N/A;  . EXCISION ORAL LESION WITH CO2 LASER N/A 11/20/2018   Procedure: EXCISION ORAL LESION WITH CO2 LASER;  Surgeon: [name], MD;  Location: [place];  Service: ENT;  Laterality: N/A;  . EYE SURGERY     [facility] eye surgeries  . LEFT HEART CATHETERIZATION WITH CORONARY ANGIOGRAM N/A 05/18/2012   Procedure: LEFT HEART CATHETERIZATION WITH CORONARY ANGIOGRAM;  Surgeon: Josue Hector, MD;  Location: [facility] LAB;  Service: Cardiovascular;  Laterality: N/A;     OB History   No obstetric history on file.      Home Medications    Prior to Admission medications   Medication Sig Start Date End Date Taking? Authorizing Provider  albuterol (PROVENTIL HFA;VENTOLIN HFA) 108 (90 BASE) MCG/ACT inhaler Inhale 1 puff into the lungs See admin instructions. Inhale 1 puff daily, may inhale 1 puff every 4 to 6 hours as needed for shortness of breath   Yes [provider]  aspirin 325 MG tablet Take 325 mg by mouth daily as needed for  moderate pain.   Yes [provider]  calcium carbonate (OSCAL) 1500 (600 Ca) MG TABS tablet Take 600 mg of elemental calcium by mouth daily.   Yes [provider]  Cholecalciferol (VITAMIN D) 2000 UNITS tablet Take 2,000 Units by mouth daily with supper.    Yes [provider]  clindamycin (CLEOCIN) 300 MG capsule Take 1 capsule (300 mg total) by mouth 3 (three) times daily. 11/20/18  Yes [name], MD  clonazePAM (KLONOPIN) 0.5 MG tablet Take 0.25 mg by mouth See admin instructions. Take 0.25 mg by mouth at night, may take an additional 0.25 mg as needed for anxiety 07/26/18  Yes [provider]   diphenhydrAMINE (BENADRYL) 25 MG tablet Take 25 mg by mouth 2 (two) times a day.   Yes [provider]  fluocinonide ointment (LIDEX) 4.81 % Apply 1 application topically See admin instructions. Apply a thin layer to oral lesions 4 - 6 times daily as needed for mouth sores   Yes [provider]  Fluticasone-Salmeterol (ADVAIR) 250-50 MCG/DOSE AEPB Inhale 1 puff into the lungs See admin instructions. Inhale 1 puff daily, may inhale a second dose as needed for shortness of breath   Yes [provider]  HYDROcodone-acetaminophen (NORCO) 7.5-325 MG tablet Take 1 tablet by mouth every 6 (six) hours as needed for moderate pain. 11/20/18  Yes [name], MD  HYDROcodone-acetaminophen (NORCO/VICODIN) 5-325 MG tablet Take 0.25 tablets by mouth 2 (two) times daily as needed for moderate pain.   Yes [provider]  multivitamin-lutein (OCUVITE-LUTEIN) CAPS Take 1 capsule by mouth every evening.    Yes [provider]  pantoprazole (PROTONIX) 40 MG tablet Take 40 mg by mouth daily.   Yes [provider]  pravastatin (PRAVACHOL) 20 MG tablet Take 20 mg by mouth daily with supper.    Yes [provider]    Family History No family history on file.  Social History Social History   Tobacco Use  . Smoking status: Former Smoker    Packs/day: 1.00    Years: 35.00    Pack years: 35.00    Types: Cigarettes    Last attempt to quit: 07/26/1999    Years since quitting: 19.3  . Smokeless tobacco: Never Used  Substance Use Topics  . Alcohol use: No  . Drug use: No     Allergies   Erythromycin; Nitroglycerin; and Sulfa antibiotics   Review of Systems Review of Systems  Constitutional: Negative for chills and fever.  HENT: Positive for mouth sores. Negative for congestion.        Oral bleeding, controlled at this time.  Respiratory: Negative for chest tightness and shortness of breath.   Cardiovascular: Negative for chest pain.   Gastrointestinal: Negative for abdominal pain, diarrhea, nausea and vomiting.  Musculoskeletal: Negative for back pain and neck pain.  Psychiatric/Behavioral: Negative for confusion.  All other systems reviewed and are negative.    Physical Exam Updated Vital Signs BP 128/69   Pulse 84   Temp 97.9 F (36.6 C) (Oral)   Resp 16   Ht 5\' 6"  (1.676 m)   Wt 51.7 kg   SpO2 96%   BMI 18.40 kg/m   Physical Exam Vitals signs and nursing note reviewed.  Constitutional:      General: She is not in acute distress.    Appearance: She is well-developed. She is not diaphoretic.  HENT:     Head: Normocephalic and atraumatic.     Mouth/Throat:  Comments: Gauze was not removed from oral cavity as patient reports she is not actively bleeding. Eyes:     General: No scleral icterus.       Right eye: No discharge.        Left eye: No discharge.     Conjunctiva/sclera: Conjunctivae normal.  Neck:     Musculoskeletal: Normal range of motion.  Cardiovascular:     Rate and Rhythm: Normal rate and regular rhythm.     Pulses: Normal pulses.     Heart sounds: No murmur.  Pulmonary:     Effort: Pulmonary effort is normal. No respiratory distress.     Breath sounds: Normal breath sounds. No stridor.  Abdominal:     General: There is no distension.     Tenderness: There is no abdominal tenderness.  Musculoskeletal:        General: No deformity or signs of injury.  Skin:    General: Skin is warm and dry.  Neurological:     General: No focal deficit present.     Mental Status: She is alert and oriented to person, place, and time.     Motor: No abnormal muscle tone.  Psychiatric:        Mood and Affect: Mood normal.        Behavior: Behavior normal.      ED Treatments / Results  Labs (all labs ordered are listed, but only abnormal results are displayed) Labs Reviewed  CBC  PROTIME-INR  APTT  BASIC METABOLIC PANEL  TYPE AND SCREEN    EKG None  Radiology No results found.   Procedures Procedures (including critical care time)  Medications Ordered in ED Medications  0.45 % NaCl with KCl 20 mEq / L infusion (has no administration in time range)  tranexamic acid (CYKLOKAPRON) injection 500 mg (has no administration in time range)  ondansetron (ZOFRAN) injection 4 mg (has no administration in time range)  fentaNYL (SUBLIMAZE) injection 50 mcg (has no administration in time range)  albuterol (VENTOLIN HFA) 108 (90 Base) MCG/ACT inhaler 2 puff (2 puffs Inhalation Given 11/23/18 0445)     Initial Impression / Assessment and Plan / ED Course  I have reviewed the triage vital signs and the nursing notes.  Pertinent labs & imaging results that were available during my care of the patient were reviewed by me and considered in my medical decision making (see chart for details).  Clinical Course as of Nov 23 730  Fri Nov 23, 2018  0455 Spoke with Dr. Constance Holster who will take patient to the OR later this morning.  He will admit her.    [EH]  5009 Spoke with Dr. Constance Holster, patient stared having large volume of bleeding.  He is aware and will come in and take patient to the OR.    [EH]    Clinical Course User Index [EH] Lorin Glass, PA-C      Patient presents today as a transfer from [facility] rocking him for Dr. Constance Holster to see.  She is 3 days status post CO2 laser excision of intraoral lesions and this is her second time presenting to the ER for ENT evaluation for oral bleeding.  She is not actively bleeding at this time.  She requested albuterol inhaler which was given, after which she felt back to normal.  Woke with Dr. Constance Holster who states that he will admit patient onto his service with plans to take her to the operating room in the morning.  This patient was seen  as a shared visit with Dr. Leonides Schanz. Patient did have reported 2 g drop in hemoglobin, therefore CBC was drawn on arrival.   Final Clinical Impressions(s) / ED Diagnoses   Final diagnoses:  Bleeding from mouth     ED Discharge Orders    None       Lorin Glass, PA-C 11/23/18 0502    Ward, Delice Bison, DO 11/23/18 0509    Addendum: patient starting having bleeding again.  TXA ordered.  I called Dr. Constance Holster who will come take patient to the OR.  Dr. Leonides Schanz able to slow bleeding.  Patient with out repeat bleeding while in the emergency room.     Lorin Glass, PA-C 11/23/18 0732    Ward, Delice Bison, DO 11/23/18 [phone]

## 2018-11-23 NOTE — ED Notes (Signed)
Report given to PreOp RN

## 2018-11-23 NOTE — ED Notes (Signed)
ED TO INPATIENT HANDOFF REPORT  ED Nurse Name and Phone #: [phone] Lucita Ferrara Name/Age/Gender Meredith Stewart 78 y.o. female Room/Bed: 019C/019C  Code Status   Code Status: Full Code  Home/SNF/Other Home Patient oriented to: self, place, time and situation Is this baseline? Yes   Triage Complete: Triage complete  Chief Complaint post op  Triage Note Patient is transfer from [facility], had oral surgery on 4/28 to have a spot removed on roof of mouth.  Patient was seen on 4/29 here and seen at [facility] last night.  States that she has been bleeding a lot per patient.  Hgb has dropped from 12 to 10.   Allergies Allergies  Allergen Reactions  . Erythromycin Hives  . Nitroglycerin Other (See Comments)    Severe drop in b/p  . Sulfa Antibiotics     unknown    Level of Care/Admitting Diagnosis ED Disposition    ED Disposition Condition [facility] Area: [place] [[id number]]  Level of Care: Med-Surg [16]  Covid Evaluation: N/A  Diagnosis: Oral hemorrhage [580998]  Admitting Physician: [name] [[id number]]  Attending Physician: Constance Holster, JEFRY [[id number]]  PT Class (Do Not Modify): Observation [104]  PT Acc Code (Do Not Modify): Observation [10022]       B Medical/Surgery History Past Medical History:  Diagnosis Date  . Asthma   . GERD (gastroesophageal reflux disease)   . Heart murmur   . Hyperlipidemia   . MVP (mitral valve prolapse)   . 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 Tobacco abuse    smoked x 35 years, quit 12 years ago  . Wears glasses    Past Surgical History:  Procedure Laterality Date  . ABDOMINAL HYSTERECTOMY  09/19/2018  . CARDIAC CATHETERIZATION     10/13  . DILATION AND CURETTAGE OF UTERUS    . EXCISION OF ORAL TUMOR  4/12   rt bucca-squamus cell ca  . EXCISION OF TONGUE LESION WITH LASER N/A 01/09/2013   Procedure: ORAL BIOPSY AND LASER ABLATION;  Surgeon: [name], MD;  Location:  [place];  Service: ENT;  Laterality: N/A;  . EXCISION ORAL LESION WITH CO2 LASER Right 11/09/2015   Procedure: EXCISION ORAL LESION WITH CO2 LASER AND FROZEN SECTION ;  Surgeon: [name], MD;  Location: [place];  Service: ENT;  Laterality: Right;  . EXCISION ORAL LESION WITH CO2 LASER N/A 08/13/2018   Procedure: EXCISION/biopsy ORAL LESION WITH CO2 LASER ablation;  Surgeon: [name], MD;  Location: [place];  Service: ENT;  Laterality: N/A;  . EXCISION ORAL LESION WITH CO2 LASER N/A 11/20/2018   Procedure: EXCISION ORAL LESION WITH CO2 LASER;  Surgeon: [name], MD;  Location: [place];  Service: ENT;  Laterality: N/A;  . EYE SURGERY     [facility] eye surgeries  . LEFT HEART CATHETERIZATION WITH CORONARY ANGIOGRAM N/A 05/18/2012   Procedure: LEFT HEART CATHETERIZATION WITH CORONARY ANGIOGRAM;  Surgeon: Josue Hector, MD;  Location: [facility] CATH LAB;  Service: Cardiovascular;  Laterality: N/A;     A IV Location/Drains/Wounds Patient Lines/Drains/Airways Status   Active Line/Drains/Airways    Name:   Placement date:   Placement time:   Site:   Days:   Post Cath / Sheath 05/18/12 Right Radial   05/18/12    -    Radial   2380   Incision 01/09/13 Lip Left   01/09/13  1042     2144   Incision (Closed) 11/09/15 Lip Right   11/09/15    1032     1110   Incision (Closed) 08/13/18 Lip   08/13/18    0739     102          Intake/Output Last 24 hours No intake or output data in the 24 hours ending 11/23/18 0524  Labs/Imaging No results found for this or any previous visit (from the past 48 hour(s)). No results found.  Pending Labs Unresulted Labs (From admission, onward)    Start     Ordered   11/23/18 0451  CBC  ONCE - STAT,   STAT     11/23/18 0450          Vitals/Pain Today's Vitals   11/23/18 0431 11/23/18 0439 11/23/18 0445 11/23/18 0500  BP:   129/80 128/69  Pulse:   84 84  Resp:      Temp:      TempSrc:      SpO2:   97%  96%  Weight: 51.7 kg     Height: 5\' 6"  (1.676 m)     PainSc:  0-No pain      Isolation Precautions No active isolations  Medications Medications  0.45 % NaCl with KCl 20 mEq / L infusion (has no administration in time range)  tranexamic acid (CYKLOKAPRON) injection 500 mg (has no administration in time range)  albuterol (VENTOLIN HFA) 108 (90 Base) MCG/ACT inhaler 2 puff (2 puffs Inhalation Given 11/23/18 0445)    Mobility walks with person assist Low fall risk   Focused Assessments N/A   R Recommendations: See Admitting Provider Note  Report given to:   Additional Notes: N/A

## 2018-11-23 NOTE — H&P (Signed)
Meredith Stewart is an 78 y.o. female.   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  She has been bleeding intermittently.  She was transferred here from outside hospital for continued care.  Past Medical History:  Diagnosis Date  . Asthma   . GERD (gastroesophageal reflux disease)   . Heart murmur   . Hyperlipidemia   . MVP (mitral valve prolapse)   . 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 Tobacco abuse    smoked x 35 years, quit 12 years ago  . Wears glasses     Past Surgical History:  Procedure Laterality Date  . ABDOMINAL HYSTERECTOMY  09/19/2018  . CARDIAC CATHETERIZATION     10/13  . DILATION AND CURETTAGE OF UTERUS    . EXCISION OF ORAL TUMOR  4/12   rt bucca-squamus cell ca  . EXCISION OF TONGUE LESION WITH LASER N/A 01/09/2013   Procedure: ORAL BIOPSY AND LASER ABLATION;  Surgeon: [name], MD;  Location: [place];  Service: ENT;  Laterality: N/A;  . EXCISION ORAL LESION WITH CO2 LASER Right 11/09/2015   Procedure: EXCISION ORAL LESION WITH CO2 LASER AND FROZEN SECTION ;  Surgeon: [name], MD;  Location: [place];  Service: ENT;  Laterality: Right;  . EXCISION ORAL LESION WITH CO2 LASER N/A 08/13/2018   Procedure: EXCISION/biopsy ORAL LESION WITH CO2 LASER ablation;  Surgeon: [name], MD;  Location: [place];  Service: ENT;  Laterality: N/A;  . EXCISION ORAL LESION WITH CO2 LASER N/A 11/20/2018   Procedure: EXCISION ORAL LESION WITH CO2 LASER;  Surgeon: [name], MD;  Location: [place];  Service: ENT;  Laterality: N/A;  . EYE SURGERY     [facility] eye surgeries  . LEFT HEART CATHETERIZATION WITH CORONARY ANGIOGRAM N/A 05/18/2012   Procedure: LEFT HEART CATHETERIZATION WITH CORONARY ANGIOGRAM;  Surgeon: Josue Hector, MD;  Location: [facility];  Service: Cardiovascular;  Laterality: N/A;    No family history on file. Social History:  reports that she  quit smoking about 19 years ago. Her smoking use included cigarettes. She has a 35.00 pack-year smoking history. She has never used smokeless tobacco. She reports that she does not drink alcohol or use drugs.  Allergies:  Allergies  Allergen Reactions  . Erythromycin Hives  . Nitroglycerin Other (See Comments)    Severe drop in b/p  . Sulfa Antibiotics     unknown    (Not in a hospital admission)   Results for orders placed or performed during the hospital encounter of 11/23/18 (from the past 48 hour(s))  CBC     Status: Abnormal   Collection Time: 11/23/18  5:09 AM  Result Value Ref Range   WBC 8.8 4.0 - 10.5 K/uL   RBC 3.57 (L) 3.87 - 5.11 MIL/uL   Hemoglobin 10.4 (L) 12.0 - 15.0 g/dL   HCT 31.8 (L) 36.0 - 46.0 %   MCV 89.1 80.0 - 100.0 fL   MCH 29.1 26.0 - 34.0 pg   MCHC 32.7 30.0 - 36.0 g/dL   RDW 18.5 (H) 11.5 - 15.5 %   Platelets 232 150 - 400 K/uL   nRBC 0.0 0.0 - 0.2 %    Comment: Performed at [facility], [place] [address] [place], [place] [address]   No results found.  ROS: otherwise negative  Blood pressure 114/84, pulse 87, temperature 97.9 F (36.6 C), temperature source Oral,  resp. rate 16, height 5\' 6"  (1.676 m), weight 51.7 kg, SpO2 99 %.  PHYSICAL EXAM: Overall appearance:  Healthy appearing, in no distress.  She has gauze in her oral cavity but she is able to communicate effectively.  There is no active bleeding at the moment. Head:  Normocephalic, atraumatic. Ears: External ears are healthy. Nose: External nose is healthy in appearance. Internal nasal exam free of any lesions or obstruction. Oral Cavity/pharynx: Unable to evaluate in the emergency department at this time. Neuro:  No identifiable neurologic deficits. Neck: No palpable neck masses.  Studies Reviewed: none    Assessment/Plan Postop oral hemorrhage following oral cavity tumor resection.  Recommend urgent surgical intervention with exam under anesthesia, cauterization, and  possible skin graft to assist with healing of the surgical site.  [name] 11/23/2018, 5:54 AM

## 2018-11-24 ENCOUNTER — Encounter (HOSPITAL_COMMUNITY): Payer: Self-pay | Admitting: Otolaryngology

## 2018-11-24 LAB — BPAM RBC
Blood Product Expiration Date: 202005232359
Blood Product Expiration Date: 202005232359
Unit Type and Rh: 5100
Unit Type and Rh: 5100

## 2018-11-24 LAB — TYPE AND SCREEN
ABO/RH(D): O POS
Antibody Screen: POSITIVE
Unit division: 0
Unit division: 0

## 2018-11-27 NOTE — Discharge Summary (Signed)
Physician Discharge Summary  Patient ID: Meredith Stewart MRN: [id number] DOB/AGE: 1941-02-16 78 y.o.  Admit date: 11/23/2018 Discharge date: 11/27/2018  Admission Diagnoses:oral cancer, postop hemorrhage   Discharge Diagnoses:  Active Problems:   Oral hemorrhage   Discharged Condition: good  Hospital Course: no complications  Consults: none  Significant Diagnostic Studies: none  Treatments: surgery: Cauterization and skin graft  Discharge Exam: Blood pressure 125/68, pulse 84, temperature 97.7 F (36.5 C), temperature source Oral, resp. rate 13, height 5\' 6"  (1.676 m), weight 51.7 kg, SpO2 99 %. PHYSICAL EXAM: Taking po liquids well, no breathing problems, no bleeding  Disposition:   Discharge Instructions    Diet - low sodium heart healthy   Complete by:  As directed    Increase activity slowly   Complete by:  As directed      Allergies as of 11/23/2018      Reactions   Erythromycin Hives   Nitroglycerin Other (See Comments)   Severe drop in b/p   Sulfa Antibiotics    unknown      Medication List    TAKE these medications   albuterol 108 (90 Base) MCG/ACT inhaler Commonly known as:  VENTOLIN HFA Inhale 1 puff into the lungs See admin instructions. Inhale 1 puff daily, may inhale 1 puff every 4 to 6 hours as needed for shortness of breath   aspirin 325 MG tablet Take 325 mg by mouth daily as needed for moderate pain.   calcium carbonate 1500 (600 Ca) MG Tabs tablet Commonly known as:  OSCAL Take 600 mg of elemental calcium by mouth daily.   clindamycin 300 MG capsule Commonly known as:  Cleocin Take 1 capsule (300 mg total) by mouth 3 (three) times daily. What changed:  Another medication with the same name was added. Make sure you understand how and when to take each. Notes to patient:  Last taken at 0703 am (5/1)   clindamycin 300 MG capsule Commonly known as:  Cleocin Take 1 capsule (300 mg total) by mouth 3 (three) times daily. What changed:  You  were already taking a medication with the same name, and this prescription was added. Make sure you understand how and when to take each.   clonazePAM 0.5 MG tablet Commonly known as:  KLONOPIN Take 0.25 mg by mouth See admin instructions. Take 0.25 mg by mouth at night, may take an additional 0.25 mg as needed for anxiety   diphenhydrAMINE 25 MG tablet Commonly known as:  BENADRYL Take 25 mg by mouth 2 (two) times a day.   fluocinonide ointment 0.05 % Commonly known as:  LIDEX Apply 1 application topically See admin instructions. Apply a thin layer to oral lesions 4 - 6 times daily as needed for mouth sores   Fluticasone-Salmeterol 250-50 MCG/DOSE Aepb Commonly known as:  ADVAIR Inhale 1 puff into the lungs See admin instructions. Inhale 1 puff daily, may inhale a second dose as needed for shortness of breath   HYDROcodone-acetaminophen 5-325 MG tablet Commonly known as:  NORCO/VICODIN Take 0.25 tablets by mouth 2 (two) times daily as needed for moderate pain. What changed:  Another medication with the same name was added. Make sure you understand how and when to take each.   HYDROcodone-acetaminophen 7.5-325 MG tablet Commonly known as:  Norco Take 1 tablet by mouth every 6 (six) hours as needed for moderate pain. What changed:  Another medication with the same name was added. Make sure you understand how and when to take each.  HYDROcodone-acetaminophen 7.5-325 MG tablet Commonly known as:  Norco Take 1 tablet by mouth every 6 (six) hours as needed for moderate pain. What changed:  You were already taking a medication with the same name, and this prescription was added. Make sure you understand how and when to take each.   multivitamin-lutein Caps capsule Take 1 capsule by mouth every evening.   pantoprazole 40 MG tablet Commonly known as:  PROTONIX Take 40 mg by mouth daily.   pravastatin 20 MG tablet Commonly known as:  PRAVACHOL Take 20 mg by mouth daily with  supper.   Vitamin D 50 MCG (2000 UT) tablet Take 2,000 Units by mouth daily with supper.      Follow-up Information    [name], MD. Schedule an appointment as soon as possible for a visit on 11/29/2018.   Specialty:  Otolaryngology Contact information: [address] [place] [address] [phone]           Signed: [name] 11/27/2018, 10:19 AM

## 2018-12-12 ENCOUNTER — Encounter (HOSPITAL_COMMUNITY): Payer: Self-pay | Admitting: Otolaryngology

## 2019-01-09 DIAGNOSIS — M79671 Pain in right foot: Secondary | ICD-10-CM | POA: Diagnosis not present

## 2019-01-09 DIAGNOSIS — M79672 Pain in left foot: Secondary | ICD-10-CM | POA: Diagnosis not present

## 2019-01-09 DIAGNOSIS — I739 Peripheral vascular disease, unspecified: Secondary | ICD-10-CM | POA: Diagnosis not present

## 2019-01-09 DIAGNOSIS — L110 Acquired keratosis follicularis: Secondary | ICD-10-CM | POA: Diagnosis not present

## 2019-01-14 DIAGNOSIS — L438 Other lichen planus: Secondary | ICD-10-CM | POA: Diagnosis not present

## 2019-02-11 DIAGNOSIS — I10 Essential (primary) hypertension: Secondary | ICD-10-CM | POA: Diagnosis not present

## 2019-02-11 DIAGNOSIS — C069 Malignant neoplasm of mouth, unspecified: Secondary | ICD-10-CM | POA: Diagnosis not present

## 2019-02-11 DIAGNOSIS — F419 Anxiety disorder, unspecified: Secondary | ICD-10-CM | POA: Diagnosis not present

## 2019-02-11 DIAGNOSIS — Z299 Encounter for prophylactic measures, unspecified: Secondary | ICD-10-CM | POA: Diagnosis not present

## 2019-02-11 DIAGNOSIS — Z681 Body mass index (BMI) 19 or less, adult: Secondary | ICD-10-CM | POA: Diagnosis not present

## 2019-02-11 DIAGNOSIS — J449 Chronic obstructive pulmonary disease, unspecified: Secondary | ICD-10-CM | POA: Diagnosis not present

## 2019-02-11 DIAGNOSIS — J45909 Unspecified asthma, uncomplicated: Secondary | ICD-10-CM | POA: Diagnosis not present

## 2019-03-05 DIAGNOSIS — N8111 Cystocele, midline: Secondary | ICD-10-CM | POA: Diagnosis not present

## 2019-03-27 DIAGNOSIS — M79672 Pain in left foot: Secondary | ICD-10-CM | POA: Diagnosis not present

## 2019-03-27 DIAGNOSIS — I739 Peripheral vascular disease, unspecified: Secondary | ICD-10-CM | POA: Diagnosis not present

## 2019-03-27 DIAGNOSIS — L110 Acquired keratosis follicularis: Secondary | ICD-10-CM | POA: Diagnosis not present

## 2019-03-27 DIAGNOSIS — M79671 Pain in right foot: Secondary | ICD-10-CM | POA: Diagnosis not present

## 2019-04-04 DIAGNOSIS — Z23 Encounter for immunization: Secondary | ICD-10-CM | POA: Diagnosis not present

## 2019-04-11 ENCOUNTER — Other Ambulatory Visit: Payer: Self-pay

## 2019-04-11 ENCOUNTER — Encounter (INDEPENDENT_AMBULATORY_CARE_PROVIDER_SITE_OTHER): Payer: Medicare Other | Admitting: Ophthalmology

## 2019-04-11 DIAGNOSIS — H353134 Nonexudative age-related macular degeneration, bilateral, advanced atrophic with subfoveal involvement: Secondary | ICD-10-CM

## 2019-04-11 DIAGNOSIS — H43813 Vitreous degeneration, bilateral: Secondary | ICD-10-CM

## 2019-04-16 DIAGNOSIS — L438 Other lichen planus: Secondary | ICD-10-CM | POA: Diagnosis not present

## 2019-04-16 DIAGNOSIS — Z85828 Personal history of other malignant neoplasm of skin: Secondary | ICD-10-CM | POA: Diagnosis not present

## 2019-04-23 DIAGNOSIS — K1321 Leukoplakia of oral mucosa, including tongue: Secondary | ICD-10-CM | POA: Diagnosis not present

## 2019-04-24 ENCOUNTER — Ambulatory Visit (INDEPENDENT_AMBULATORY_CARE_PROVIDER_SITE_OTHER): Payer: Medicare Other | Admitting: Urology

## 2019-04-24 DIAGNOSIS — N819 Female genital prolapse, unspecified: Secondary | ICD-10-CM

## 2019-04-26 DIAGNOSIS — C069 Malignant neoplasm of mouth, unspecified: Secondary | ICD-10-CM | POA: Diagnosis not present

## 2019-04-26 DIAGNOSIS — Z713 Dietary counseling and surveillance: Secondary | ICD-10-CM | POA: Diagnosis not present

## 2019-04-26 DIAGNOSIS — Z681 Body mass index (BMI) 19 or less, adult: Secondary | ICD-10-CM | POA: Diagnosis not present

## 2019-04-26 DIAGNOSIS — Z299 Encounter for prophylactic measures, unspecified: Secondary | ICD-10-CM | POA: Diagnosis not present

## 2019-04-26 DIAGNOSIS — J449 Chronic obstructive pulmonary disease, unspecified: Secondary | ICD-10-CM | POA: Diagnosis not present

## 2019-05-01 NOTE — H&P (Signed)
Meredith Stewart is an 78 y.o. female.   Chief Complaint: Oral cancer HPI: New lesion right side posterior hard palate  Past Medical History:  Diagnosis Date  . Asthma   . GERD (gastroesophageal reflux disease)   . Heart murmur   . Hyperlipidemia   . MVP (mitral valve prolapse)   . 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 Tobacco abuse    smoked x 35 years, quit 12 years ago  . Wears glasses     Past Surgical History:  Procedure Laterality Date  . ABDOMINAL HYSTERECTOMY  09/19/2018  . CARDIAC CATHETERIZATION     10/13  . DILATION AND CURETTAGE OF UTERUS    . EXCISION OF ORAL TUMOR  4/12   rt bucca-squamus cell ca  . EXCISION OF TONGUE LESION WITH LASER N/A 01/09/2013   Procedure: ORAL BIOPSY AND LASER ABLATION;  Surgeon: [name], MD;  Location: [place];  Service: ENT;  Laterality: N/A;  . EXCISION ORAL LESION WITH CO2 LASER Right 11/09/2015   Procedure: EXCISION ORAL LESION WITH CO2 LASER AND FROZEN SECTION ;  Surgeon: [name], MD;  Location: [place];  Service: ENT;  Laterality: Right;  . EXCISION ORAL LESION WITH CO2 LASER N/A 08/13/2018   Procedure: EXCISION/biopsy ORAL LESION WITH CO2 LASER ablation;  Surgeon: [name], MD;  Location: [place];  Service: ENT;  Laterality: N/A;  . EXCISION ORAL LESION WITH CO2 LASER N/A 11/20/2018   Procedure: EXCISION ORAL LESION WITH CO2 LASER;  Surgeon: [name], MD;  Location: [place];  Service: ENT;  Laterality: N/A;  . EYE SURGERY     [facility] eye surgeries  . LEFT HEART CATHETERIZATION WITH CORONARY ANGIOGRAM N/A 05/18/2012   Procedure: LEFT HEART CATHETERIZATION WITH CORONARY ANGIOGRAM;  Surgeon: Josue Hector, MD;  Location: [facility] C[facility] LAB;  Service: Cardiovascular;  Laterality: N/A;  . SKIN FULL THICKNESS GRAFT Right 11/23/2018   Procedure: SKIN GRAFT FULL THICKNESS;  Surgeon: [name], MD;  Location: [place];  Service: ENT;  Laterality: Right;    No family  history on file. Social History:  reports that she quit smoking about 19 years ago. Her smoking use included cigarettes. She has a 35.00 pack-year smoking history. She has never used smokeless tobacco. She reports that she does not drink alcohol or use drugs.  Allergies:  Allergies  Allergen Reactions  . Erythromycin Hives  . Nitroglycerin Other (See Comments)    Severe drop in b/p  . Sulfa Antibiotics     unknown    No medications prior to admission.    No results found for this or any previous visit (from the past 48 hour(s)). No results found.  ROS: otherwise negative  There were no vitals taken for this visit.  PHYSICAL EXAM: Overall appearance:  Healthy appearing, in no distress Head:  Normocephalic, atraumatic. Ears: External auditory canals are clear; tympanic membranes are intact and the middle ears are free of any effusion. Nose: External nose is healthy in appearance. Internal nasal exam free of any lesions or obstruction. Oral Cavity/pharynx: Superficial ulceration with exposed bone right posterior hard palate. Neuro:  No identifiable neurologic deficits. Neck: No palpable neck masses.  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  Recommend biopsy, laser excision and debridement of bone.  [name] 05/01/2019, 11:19 AM

## 2019-05-06 ENCOUNTER — Encounter (HOSPITAL_COMMUNITY): Payer: Self-pay

## 2019-05-06 ENCOUNTER — Other Ambulatory Visit: Payer: Self-pay

## 2019-05-06 ENCOUNTER — Other Ambulatory Visit (HOSPITAL_COMMUNITY)
Admission: RE | Admit: 2019-05-06 | Discharge: 2019-05-06 | Disposition: A | Discharge status: Home / Self Care | Payer: Medicare Other | Source: Ambulatory Visit | Attending: Otolaryngology | Admitting: Otolaryngology

## 2019-05-06 DIAGNOSIS — Z20828 Contact with and (suspected) exposure to other viral communicable diseases: Secondary | ICD-10-CM | POA: Diagnosis not present

## 2019-05-06 DIAGNOSIS — Z01812 Encounter for preprocedural laboratory examination: Secondary | ICD-10-CM | POA: Diagnosis not present

## 2019-05-06 LAB — SARS CORONAVIRUS 2 (TAT 6-24 HRS): SARS Coronavirus 2: NEGATIVE

## 2019-05-08 ENCOUNTER — Encounter (HOSPITAL_COMMUNITY): Payer: Self-pay

## 2019-05-08 ENCOUNTER — Ambulatory Visit (HOSPITAL_COMMUNITY): Payer: Medicare Other | Admitting: Certified Registered"

## 2019-05-08 ENCOUNTER — Other Ambulatory Visit: Payer: Self-pay

## 2019-05-08 ENCOUNTER — Encounter (HOSPITAL_COMMUNITY)
Admission: RE | Disposition: A | Discharge status: Home / Self Care | Payer: Self-pay | Source: Home / Self Care | Attending: Otolaryngology

## 2019-05-08 ENCOUNTER — Ambulatory Visit (HOSPITAL_COMMUNITY)
Admission: RE | Admit: 2019-05-08 | Discharge: 2019-05-08 | Disposition: A | Discharge status: Home / Self Care | Payer: Medicare Other | Attending: Otolaryngology | Admitting: Otolaryngology

## 2019-05-08 DIAGNOSIS — A4289 Other forms of actinomycosis: Secondary | ICD-10-CM | POA: Diagnosis not present

## 2019-05-08 DIAGNOSIS — K1321 Leukoplakia of oral mucosa, including tongue: Secondary | ICD-10-CM | POA: Insufficient documentation

## 2019-05-08 DIAGNOSIS — E785 Hyperlipidemia, unspecified: Secondary | ICD-10-CM | POA: Insufficient documentation

## 2019-05-08 DIAGNOSIS — Z7951 Long term (current) use of inhaled steroids: Secondary | ICD-10-CM | POA: Diagnosis not present

## 2019-05-08 DIAGNOSIS — Z79899 Other long term (current) drug therapy: Secondary | ICD-10-CM | POA: Insufficient documentation

## 2019-05-08 DIAGNOSIS — C050 Malignant neoplasm of hard palate: Secondary | ICD-10-CM | POA: Insufficient documentation

## 2019-05-08 DIAGNOSIS — K121 Other forms of stomatitis: Secondary | ICD-10-CM | POA: Diagnosis not present

## 2019-05-08 DIAGNOSIS — Z85818 Personal history of malignant neoplasm of other sites of lip, oral cavity, and pharynx: Secondary | ICD-10-CM | POA: Insufficient documentation

## 2019-05-08 DIAGNOSIS — C059 Malignant neoplasm of palate, unspecified: Secondary | ICD-10-CM | POA: Diagnosis not present

## 2019-05-08 DIAGNOSIS — K219 Gastro-esophageal reflux disease without esophagitis: Secondary | ICD-10-CM | POA: Insufficient documentation

## 2019-05-08 DIAGNOSIS — J45909 Unspecified asthma, uncomplicated: Secondary | ICD-10-CM | POA: Insufficient documentation

## 2019-05-08 DIAGNOSIS — C148 Malignant neoplasm of overlapping sites of lip, oral cavity and pharynx: Secondary | ICD-10-CM | POA: Diagnosis not present

## 2019-05-08 DIAGNOSIS — Z87891 Personal history of nicotine dependence: Secondary | ICD-10-CM | POA: Insufficient documentation

## 2019-05-08 HISTORY — PX: EXCISION ORAL TUMOR: SHX6265

## 2019-05-08 LAB — CBC
HCT: 41.3 % (ref 36.0–46.0)
Hemoglobin: 13.7 g/dL (ref 12.0–15.0)
MCH: 31.6 pg (ref 26.0–34.0)
MCHC: 33.2 g/dL (ref 30.0–36.0)
MCV: 95.2 fL (ref 80.0–100.0)
Platelets: 264 10*3/uL (ref 150–400)
RBC: 4.34 MIL/uL (ref 3.87–5.11)
RDW: 12.9 % (ref 11.5–15.5)
WBC: 5.1 10*3/uL (ref 4.0–10.5)
nRBC: 0 % (ref 0.0–0.2)

## 2019-05-08 LAB — BASIC METABOLIC PANEL
Anion gap: 12 (ref 5–15)
BUN: 13 mg/dL (ref 8–23)
CO2: 25 mmol/L (ref 22–32)
Calcium: 9.8 mg/dL (ref 8.9–10.3)
Chloride: 95 mmol/L — ABNORMAL LOW (ref 98–111)
Creatinine, Ser: 0.72 mg/dL (ref 0.44–1.00)
GFR calc Af Amer: >60 mL/min (ref >60)
GFR calc non Af Amer: >60 mL/min (ref >60)
Glucose, Bld: 99 mg/dL (ref 70–99)
Potassium: 4.1 mmol/L (ref 3.5–5.1)
Sodium: 132 mmol/L — ABNORMAL LOW (ref 135–145)

## 2019-05-08 SURGERY — EXCISION, NEOPLASM, MOUTH
Anesthesia: General | Site: Mouth

## 2019-05-08 MED ORDER — LACTATED RINGERS IV SOLN
INTRAVENOUS | Status: DC
  Administered 2019-05-08: 10:00:00 via INTRAVENOUS

## 2019-05-08 MED ORDER — ONDANSETRON HCL 4 MG/2ML IJ SOLN
INTRAMUSCULAR | Status: AC
  Filled 2019-05-08: qty 2

## 2019-05-08 MED ORDER — PROMETHAZINE HCL 25 MG RE SUPP: 25.0000 mg | Freq: Four times a day (QID) | RECTAL | 1 refills | Status: DC | PRN

## 2019-05-08 MED ORDER — ROCURONIUM BROMIDE 10 MG/ML (PF) SYRINGE
PREFILLED_SYRINGE | INTRAVENOUS | Status: AC
  Filled 2019-05-08: qty 10

## 2019-05-08 MED ORDER — BACITRACIN ZINC 500 UNIT/GM EX OINT
TOPICAL_OINTMENT | CUTANEOUS | Status: AC
  Filled 2019-05-08: qty 28.35

## 2019-05-08 MED ORDER — PROPOFOL 10 MG/ML IV BOLUS
INTRAVENOUS | Status: DC | PRN
  Administered 2019-05-08: 60 mg via INTRAVENOUS

## 2019-05-08 MED ORDER — FENTANYL CITRATE (PF) 100 MCG/2ML IJ SOLN
INTRAMUSCULAR | Status: DC | PRN
  Administered 2019-05-08: 50 ug via INTRAVENOUS

## 2019-05-08 MED ORDER — MIDAZOLAM HCL 2 MG/2ML IJ SOLN
INTRAMUSCULAR | Status: AC
  Filled 2019-05-08: qty 2

## 2019-05-08 MED ORDER — CLINDAMYCIN HCL 300 MG PO CAPS: 300.0000 mg | ORAL_CAPSULE | Freq: Three times a day (TID) | ORAL | 0 refills | Status: DC

## 2019-05-08 MED ORDER — FENTANYL CITRATE (PF) 250 MCG/5ML IJ SOLN
INTRAMUSCULAR | Status: AC
  Filled 2019-05-08: qty 5

## 2019-05-08 MED ORDER — FENTANYL CITRATE (PF) 100 MCG/2ML IJ SOLN: 25.0000 ug | INTRAMUSCULAR | Status: DC | PRN

## 2019-05-08 MED ORDER — STERILE WATER FOR IRRIGATION IR SOLN
Status: DC | PRN
  Administered 2019-05-08: 1000 mL

## 2019-05-08 MED ORDER — LIDOCAINE 2% (20 MG/ML) 5 ML SYRINGE
INTRAMUSCULAR | Status: DC | PRN
  Administered 2019-05-08: 60 mg via INTRAVENOUS

## 2019-05-08 MED ORDER — ROCURONIUM BROMIDE 10 MG/ML (PF) SYRINGE
PREFILLED_SYRINGE | INTRAVENOUS | Status: DC | PRN
  Administered 2019-05-08: 40 mg via INTRAVENOUS

## 2019-05-08 MED ORDER — PROPOFOL 10 MG/ML IV BOLUS
INTRAVENOUS | Status: AC
  Filled 2019-05-08: qty 20

## 2019-05-08 MED ORDER — LIDOCAINE 2% (20 MG/ML) 5 ML SYRINGE
INTRAMUSCULAR | Status: AC
  Filled 2019-05-08: qty 5

## 2019-05-08 MED ORDER — MIDAZOLAM HCL 5 MG/5ML IJ SOLN
INTRAMUSCULAR | Status: DC | PRN
  Administered 2019-05-08: 2 mg via INTRAVENOUS

## 2019-05-08 MED ORDER — SUGAMMADEX SODIUM 200 MG/2ML IV SOLN
INTRAVENOUS | Status: DC | PRN
  Administered 2019-05-08: 200 mg via INTRAVENOUS

## 2019-05-08 MED ORDER — 0.9 % SODIUM CHLORIDE (POUR BTL) OPTIME
TOPICAL | Status: DC | PRN
  Administered 2019-05-08: 11:00:00 1000 mL

## 2019-05-08 MED ORDER — LIDOCAINE-EPINEPHRINE 1 %-1:100000 IJ SOLN
INTRAMUSCULAR | Status: DC | PRN
  Administered 2019-05-08: 2 mL

## 2019-05-08 MED ORDER — LIDOCAINE-EPINEPHRINE 1 %-1:100000 IJ SOLN
INTRAMUSCULAR | Status: AC
  Filled 2019-05-08: qty 1

## 2019-05-08 MED ORDER — HYDROCODONE-ACETAMINOPHEN 7.5-325 MG PO TABS: 1.0000 | ORAL_TABLET | Freq: Four times a day (QID) | ORAL | 0 refills | Status: DC | PRN

## 2019-05-08 MED ORDER — ONDANSETRON HCL 4 MG/2ML IJ SOLN
INTRAMUSCULAR | Status: DC | PRN
  Administered 2019-05-08: 4 mg via INTRAVENOUS

## 2019-05-08 SURGICAL SUPPLY — 58 items
APPLIER CLIP 9.375 SM OPEN (CLIP)
APR CLP SM 9.3 20 MLT OPN (CLIP)
BUR CROSS CUT FISSURE 1.6 (BURR) IMPLANT
BUR CROSS CUT FISSURE 1.6MM (BURR)
BUR RND DIAMOND ELITE 4.0 (BURR) ×1 IMPLANT
BUR RND DIAMOND ELITE 4.0MM (BURR)
BUR ROUND FLUTED 5 RND (BURR) ×2 IMPLANT
BUR ROUND FLUTED 5MM RND (BURR) ×1
CANISTER SUCT 3000ML PPV (MISCELLANEOUS) ×3 IMPLANT
CLEANER TIP ELECTROSURG 2X2 (MISCELLANEOUS) ×3 IMPLANT
CLIP APPLIE 9.375 SM OPEN (CLIP) IMPLANT
CONT SPEC 4OZ CLIKSEAL STRL BL (MISCELLANEOUS) ×4 IMPLANT
CORD BIPOLAR FORCEPS 12FT (ELECTRODE) ×3 IMPLANT
COVER SURGICAL LIGHT HANDLE (MISCELLANEOUS) ×3 IMPLANT
COVER WAND RF STERILE (DRAPES) ×1 IMPLANT
DRAIN CHANNEL 15F RND FF W/TCR (WOUND CARE) IMPLANT
DRAIN SNY 10 ROU (WOUND CARE) IMPLANT
DRAIN WOUND SNY 15 RND (WOUND CARE) IMPLANT
DRAPE HALF SHEET 40X57 (DRAPES) IMPLANT
DRAPE INCISE 23X17 IOBAN STRL (DRAPES)
DRAPE INCISE IOBAN 23X17 STRL (DRAPES) IMPLANT
ELECT COATED BLADE 2.86 ST (ELECTRODE) ×3 IMPLANT
ELECT REM PT RETURN 9FT ADLT (ELECTROSURGICAL) ×3
ELECTRODE REM PT RTRN 9FT ADLT (ELECTROSURGICAL) ×1 IMPLANT
EVACUATOR SILICONE 100CC (DRAIN) IMPLANT
FORCEPS BIPOLAR SPETZLER 8 1.0 (NEUROSURGERY SUPPLIES) ×3 IMPLANT
GAUZE 4X4 16PLY RFD (DISPOSABLE) ×3 IMPLANT
GLOVE BIO SURGEON STRL SZ 6.5 (GLOVE) ×2 IMPLANT
GLOVE BIO SURGEONS STRL SZ 6.5 (GLOVE) ×1
GLOVE ECLIPSE 7.5 STRL STRAW (GLOVE) ×3 IMPLANT
GOWN STRL REUS W/ TWL LRG LVL3 (GOWN DISPOSABLE) ×2 IMPLANT
GOWN STRL REUS W/TWL LRG LVL3 (GOWN DISPOSABLE) ×6
IV CATH AUTO 14GX1.75 SAFE ORG (IV SOLUTION) ×2 IMPLANT
KIT BASIN OR (CUSTOM PROCEDURE TRAY) ×3 IMPLANT
KIT TURNOVER KIT B (KITS) ×3 IMPLANT
LOCATOR NERVE 3 VOLT (DISPOSABLE) IMPLANT
NDL PRECISIONGLIDE 27X1.5 (NEEDLE) ×1 IMPLANT
NEEDLE PRECISIONGLIDE 27X1.5 (NEEDLE) ×3 IMPLANT
NS IRRIG 1000ML POUR BTL (IV SOLUTION) ×3 IMPLANT
PAD ARMBOARD 7.5X6 YLW CONV (MISCELLANEOUS) ×6 IMPLANT
PENCIL FOOT CONTROL (ELECTRODE) ×3 IMPLANT
SHEARS HARMONIC 9CM CVD (BLADE) IMPLANT
SPECIMEN JAR MEDIUM (MISCELLANEOUS) IMPLANT
SPONGE INTESTINAL PEANUT (DISPOSABLE) IMPLANT
SPONGE LAP 18X18 RF (DISPOSABLE) IMPLANT
STAPLER VISISTAT 35W (STAPLE) IMPLANT
SUT BONE WAX W31G (SUTURE) ×3 IMPLANT
SUT CHROMIC 3 0 SH 27 (SUTURE) ×3 IMPLANT
SUT CHROMIC 5 0 P 3 (SUTURE) IMPLANT
SUT ETHILON 3 0 PS 1 (SUTURE) IMPLANT
SUT ETHILON 5 0 PS 2 18 (SUTURE) IMPLANT
SUT SILK 2 0 REEL (SUTURE) IMPLANT
SUT SILK 3 0 SH CR/8 (SUTURE) ×3 IMPLANT
SUT SILK 4 0 REEL (SUTURE) ×1 IMPLANT
SUT VIC AB 3-0 SH 18 (SUTURE) IMPLANT
TRAY ENT MC OR (CUSTOM PROCEDURE TRAY) ×3 IMPLANT
TRAY FOLEY MTR SLVR 14FR STAT (SET/KITS/TRAYS/PACK) IMPLANT
TUBE FEEDING 10FR FLEXIFLO (MISCELLANEOUS) IMPLANT

## 2019-05-08 NOTE — Anesthesia Preprocedure Evaluation (Signed)
Anesthesia Evaluation  Patient identified by MRN, date of birth, ID band Patient awake    Reviewed: Allergy & Precautions, NPO status , Patient's Chart, lab work & pertinent test results  Airway Mallampati: I  TM Distance: >3 FB Neck ROM: Full    Dental  (+) Teeth Intact, Dental Advisory Given   Pulmonary asthma , former smoker,    breath sounds clear to auscultation       Cardiovascular negative cardio ROS   Rhythm:Regular Rate:Normal     Neuro/Psych negative neurological ROS  negative psych ROS   GI/Hepatic Neg liver ROS, GERD  Medicated,  Endo/Other  negative endocrine ROS  Renal/GU negative Renal ROS     Musculoskeletal negative musculoskeletal ROS (+)   Abdominal Normal abdominal exam  (+)   Peds  Hematology negative hematology ROS (+)   Anesthesia Other Findings   Reproductive/Obstetrics                             Anesthesia Physical Anesthesia Plan  ASA: II  Anesthesia Plan: General   Post-op Pain Management:    Induction: Intravenous  PONV Risk Score and Plan: 4 or greater and Ondansetron, Dexamethasone and Treatment may vary due to age or medical condition  Airway Management Planned: Oral ETT  Additional Equipment: None  Intra-op Plan:   Post-operative Plan: Extubation in OR  Informed Consent: I have reviewed the patients History and Physical, chart, labs and discussed the procedure including the risks, benefits and alternatives for the proposed anesthesia with the patient or authorized representative who has indicated his/her understanding and acceptance.     Dental advisory given  Plan Discussed with: CRNA  Anesthesia Plan Comments:         Anesthesia Quick Evaluation

## 2019-05-08 NOTE — Anesthesia Procedure Notes (Signed)
Procedure Name: Intubation Date/Time: 05/08/2019 10:51 AM Performed by: Moshe Salisbury, CRNA Pre-anesthesia Checklist: Patient identified, Emergency Drugs available, Suction available and Patient being monitored Patient Re-evaluated:Patient Re-evaluated prior to induction Oxygen Delivery Method: Circle System Utilized Preoxygenation: Pre-oxygenation with 100% oxygen Induction Type: IV induction Ventilation: Mask ventilation without difficulty Laryngoscope Size: Mac and 3 Grade View: Grade I Tube type: Oral Tube size: 7.5 mm Number of attempts: 1 Airway Equipment and Method: Stylet Placement Confirmation: ETT inserted through vocal cords under direct vision,  positive ETCO2 and breath sounds checked- equal and bilateral Secured at: 20 cm Tube secured with: Tape Dental Injury: Teeth and Oropharynx as per pre-operative assessment

## 2019-05-08 NOTE — Transfer of Care (Signed)
Immediate Anesthesia Transfer of Care Note  Patient: Meredith Stewart  Procedure(s) Performed: EXCISION ORAL CANCER WITH DEBRIDEMENT OF MAXILLARY BONE (N/A Mouth)  Patient Location: PACU  Anesthesia Type:General  Level of Consciousness: awake and patient cooperative  Airway & Oxygen Therapy: Patient Spontanous Breathing and Patient connected to nasal cannula oxygen  Post-op Assessment: Report given to RN, Post -op Vital signs reviewed and stable and Patient moving all extremities  Post vital signs: Reviewed and stable  Last Vitals:  Vitals Value Taken Time  BP    Temp    Pulse 78 05/08/19 1140  Resp 24 05/08/19 1140  SpO2 98 % 05/08/19 1140  Vitals shown include unvalidated device data.  Last Pain:  Vitals:   05/08/19 0858  TempSrc:   PainSc: 0-No pain      Patients Stated Pain Goal: 3 ([id number])  Complications: No apparent anesthesia complications

## 2019-05-08 NOTE — Discharge Instructions (Signed)
Rinse mouth with salt water 3 times daily.  Brush teeth but be careful in the upper right side to not irritate the surgical site.  Eat and drink what you are able to.

## 2019-05-08 NOTE — Op Note (Signed)
OPERATIVE REPORT  DATE OF SURGERY: 05/08/2019  PATIENT:  Meredith Stewart,  78 y.o. female  PRE-OPERATIVE DIAGNOSIS:  Oral leukoplakia, carcinoma  POST-OPERATIVE DIAGNOSIS:  Oral leukoplakia, carcinoma  PROCEDURE:  Procedure(s): EXCISION ORAL CANCER WITH DEBRIDEMENT OF MAXILLARY BONE  SURGEON:  Beckie Salts, MD  ASSISTANTS: None  ANESTHESIA:   General   EBL: 30 ml  DRAINS: None  LOCAL MEDICATIONS USED: 1% Xylocaine with epinephrine  SPECIMEN: 1.  Right posterior hard palate mucosal mass, 1 cm in diameter 2.  Right palate mass overlying maxillary tuberosity, 14 mm in greatest dimension  COUNTS:  Correct  PROCEDURE DETAILS: The patient was taken to the operating room and placed on the operating table in the supine position. Following induction of general endotracheal anesthesia, the face was draped in a standard fashion for oral surgery.  A small bite block was used on the left side to keep the mouth open.  A sweetheart tongue retractor was used during the case.  The 2 lesions were identified.  The more anterior lesion was excised using electrocautery with a 2-3 mm margin.  This was sent separate for pathologic evaluation.  The more posterior lesion overlying the maxillary tuberosity was removed in a similar fashion.  There was 1 piece of exposed bone that was actually detached from the maxilla.  This was taken with the specimen.  The exposed underlying bone was then drilled down with a round cutting bur to smooth it out.  Electrocautery was used for hemostasis.  Bone wax was also placed for completion of hemostasis.  The oral cavity and pharynx were suctioned of blood and secretions.  The patient was then awakened extubated and transferred to recovery in stable condition.    PATIENT DISPOSITION:  To PACU, stable

## 2019-05-08 NOTE — Interval H&P Note (Signed)
History and Physical Interval Note:  05/08/2019 10:16 AM  Meredith Stewart  has presented today for surgery, with the diagnosis of Oral leukoplakia.  The various methods of treatment have been discussed with the patient and family. After consideration of risks, benefits and other options for treatment, the patient has consented to  Procedure(s): EXCISION ORAL CANCER WITH DEBRIDEMENT OF MAXILLARY BONE (N/A) as a surgical intervention.  The patient's history has been reviewed, patient examined, no change in status, stable for surgery.  I have reviewed the patient's chart and labs.  Questions were answered to the patient's satisfaction.     [name]

## 2019-05-08 NOTE — Anesthesia Postprocedure Evaluation (Signed)
Anesthesia Post Note  Patient: RENONA MCNAB  Procedure(s) Performed: EXCISION ORAL CANCER WITH DEBRIDEMENT OF MAXILLARY BONE (N/A Mouth)     Patient location during evaluation: PACU Anesthesia Type: General Level of consciousness: awake and alert Pain management: pain level controlled Vital Signs Assessment: post-procedure vital signs reviewed and stable Respiratory status: spontaneous breathing, nonlabored ventilation, respiratory function stable and patient connected to nasal cannula oxygen Cardiovascular status: blood pressure returned to baseline and stable Postop Assessment: no apparent nausea or vomiting Anesthetic complications: no    Last Vitals:  Vitals:   05/08/19 1249 05/08/19 1250  BP: (!) 145/79   Pulse: 71 63  Resp:  15  Temp: 36.7 C   SpO2: 97% 95%    Last Pain:  Vitals:   05/08/19 1230  TempSrc:   PainSc: 0-No pain                 Effie Berkshire

## 2019-05-09 ENCOUNTER — Encounter (HOSPITAL_COMMUNITY): Payer: Self-pay | Admitting: Otolaryngology

## 2019-05-13 LAB — SURGICAL PATHOLOGY

## 2019-05-15 DIAGNOSIS — C069 Malignant neoplasm of mouth, unspecified: Secondary | ICD-10-CM | POA: Diagnosis not present

## 2019-05-15 DIAGNOSIS — Z299 Encounter for prophylactic measures, unspecified: Secondary | ICD-10-CM | POA: Diagnosis not present

## 2019-05-15 DIAGNOSIS — F411 Generalized anxiety disorder: Secondary | ICD-10-CM | POA: Diagnosis not present

## 2019-05-15 DIAGNOSIS — J449 Chronic obstructive pulmonary disease, unspecified: Secondary | ICD-10-CM | POA: Diagnosis not present

## 2019-05-15 DIAGNOSIS — Z681 Body mass index (BMI) 19 or less, adult: Secondary | ICD-10-CM | POA: Diagnosis not present

## 2019-06-05 DIAGNOSIS — I739 Peripheral vascular disease, unspecified: Secondary | ICD-10-CM | POA: Diagnosis not present

## 2019-06-05 DIAGNOSIS — L110 Acquired keratosis follicularis: Secondary | ICD-10-CM | POA: Diagnosis not present

## 2019-06-05 DIAGNOSIS — M79672 Pain in left foot: Secondary | ICD-10-CM | POA: Diagnosis not present

## 2019-06-05 DIAGNOSIS — M79671 Pain in right foot: Secondary | ICD-10-CM | POA: Diagnosis not present

## 2019-07-04 DIAGNOSIS — Z85828 Personal history of other malignant neoplasm of skin: Secondary | ICD-10-CM | POA: Diagnosis not present

## 2019-07-04 DIAGNOSIS — L309 Dermatitis, unspecified: Secondary | ICD-10-CM | POA: Diagnosis not present

## 2019-07-30 DIAGNOSIS — Z79899 Other long term (current) drug therapy: Secondary | ICD-10-CM | POA: Diagnosis not present

## 2019-07-30 DIAGNOSIS — L438 Other lichen planus: Secondary | ICD-10-CM | POA: Diagnosis not present

## 2019-08-08 DIAGNOSIS — Z79899 Other long term (current) drug therapy: Secondary | ICD-10-CM | POA: Diagnosis not present

## 2019-08-08 DIAGNOSIS — L438 Other lichen planus: Secondary | ICD-10-CM | POA: Diagnosis not present

## 2019-08-14 DIAGNOSIS — M79671 Pain in right foot: Secondary | ICD-10-CM | POA: Diagnosis not present

## 2019-08-14 DIAGNOSIS — L110 Acquired keratosis follicularis: Secondary | ICD-10-CM | POA: Diagnosis not present

## 2019-08-14 DIAGNOSIS — M79672 Pain in left foot: Secondary | ICD-10-CM | POA: Diagnosis not present

## 2019-08-14 DIAGNOSIS — I739 Peripheral vascular disease, unspecified: Secondary | ICD-10-CM | POA: Diagnosis not present

## 2019-08-15 DIAGNOSIS — L438 Other lichen planus: Secondary | ICD-10-CM | POA: Diagnosis not present

## 2019-08-15 DIAGNOSIS — Z79899 Other long term (current) drug therapy: Secondary | ICD-10-CM | POA: Diagnosis not present

## 2019-08-16 DIAGNOSIS — F411 Generalized anxiety disorder: Secondary | ICD-10-CM | POA: Diagnosis not present

## 2019-08-16 DIAGNOSIS — L439 Lichen planus, unspecified: Secondary | ICD-10-CM | POA: Diagnosis not present

## 2019-08-16 DIAGNOSIS — Z299 Encounter for prophylactic measures, unspecified: Secondary | ICD-10-CM | POA: Diagnosis not present

## 2019-08-16 DIAGNOSIS — C069 Malignant neoplasm of mouth, unspecified: Secondary | ICD-10-CM | POA: Diagnosis not present

## 2019-08-16 DIAGNOSIS — J449 Chronic obstructive pulmonary disease, unspecified: Secondary | ICD-10-CM | POA: Diagnosis not present

## 2019-08-16 DIAGNOSIS — Z681 Body mass index (BMI) 19 or less, adult: Secondary | ICD-10-CM | POA: Diagnosis not present

## 2019-08-20 DIAGNOSIS — K1321 Leukoplakia of oral mucosa, including tongue: Secondary | ICD-10-CM | POA: Diagnosis not present

## 2019-08-20 DIAGNOSIS — C069 Malignant neoplasm of mouth, unspecified: Secondary | ICD-10-CM | POA: Diagnosis not present

## 2019-08-20 DIAGNOSIS — Z79899 Other long term (current) drug therapy: Secondary | ICD-10-CM | POA: Diagnosis not present

## 2019-08-20 DIAGNOSIS — L439 Lichen planus, unspecified: Secondary | ICD-10-CM | POA: Diagnosis not present

## 2019-09-16 DIAGNOSIS — J449 Chronic obstructive pulmonary disease, unspecified: Secondary | ICD-10-CM | POA: Diagnosis not present

## 2019-09-16 DIAGNOSIS — L853 Xerosis cutis: Secondary | ICD-10-CM | POA: Diagnosis not present

## 2019-09-16 DIAGNOSIS — L438 Other lichen planus: Secondary | ICD-10-CM | POA: Diagnosis not present

## 2019-09-16 DIAGNOSIS — E7800 Pure hypercholesterolemia, unspecified: Secondary | ICD-10-CM | POA: Diagnosis not present

## 2019-09-17 DIAGNOSIS — J449 Chronic obstructive pulmonary disease, unspecified: Secondary | ICD-10-CM | POA: Diagnosis not present

## 2019-09-17 DIAGNOSIS — C069 Malignant neoplasm of mouth, unspecified: Secondary | ICD-10-CM | POA: Diagnosis not present

## 2019-09-17 DIAGNOSIS — Z299 Encounter for prophylactic measures, unspecified: Secondary | ICD-10-CM | POA: Diagnosis not present

## 2019-09-17 DIAGNOSIS — Z681 Body mass index (BMI) 19 or less, adult: Secondary | ICD-10-CM | POA: Diagnosis not present

## 2019-09-17 DIAGNOSIS — F411 Generalized anxiety disorder: Secondary | ICD-10-CM | POA: Diagnosis not present

## 2019-09-18 DIAGNOSIS — Z79899 Other long term (current) drug therapy: Secondary | ICD-10-CM | POA: Diagnosis not present

## 2019-09-18 DIAGNOSIS — L438 Other lichen planus: Secondary | ICD-10-CM | POA: Diagnosis not present

## 2019-10-17 DIAGNOSIS — C069 Malignant neoplasm of mouth, unspecified: Secondary | ICD-10-CM | POA: Diagnosis not present

## 2019-10-17 DIAGNOSIS — L439 Lichen planus, unspecified: Secondary | ICD-10-CM | POA: Diagnosis not present

## 2019-10-21 DIAGNOSIS — Z79899 Other long term (current) drug therapy: Secondary | ICD-10-CM | POA: Diagnosis not present

## 2019-10-21 DIAGNOSIS — L438 Other lichen planus: Secondary | ICD-10-CM | POA: Diagnosis not present

## 2019-10-23 DIAGNOSIS — I739 Peripheral vascular disease, unspecified: Secondary | ICD-10-CM | POA: Diagnosis not present

## 2019-10-23 DIAGNOSIS — M79671 Pain in right foot: Secondary | ICD-10-CM | POA: Diagnosis not present

## 2019-10-23 DIAGNOSIS — M79672 Pain in left foot: Secondary | ICD-10-CM | POA: Diagnosis not present

## 2019-10-23 DIAGNOSIS — L110 Acquired keratosis follicularis: Secondary | ICD-10-CM | POA: Diagnosis not present

## 2019-10-28 DIAGNOSIS — Z1331 Encounter for screening for depression: Secondary | ICD-10-CM | POA: Diagnosis not present

## 2019-10-28 DIAGNOSIS — Z299 Encounter for prophylactic measures, unspecified: Secondary | ICD-10-CM | POA: Diagnosis not present

## 2019-10-28 DIAGNOSIS — Z0000 Encounter for general adult medical examination without abnormal findings: Secondary | ICD-10-CM | POA: Diagnosis not present

## 2019-10-28 DIAGNOSIS — Z1211 Encounter for screening for malignant neoplasm of colon: Secondary | ICD-10-CM | POA: Diagnosis not present

## 2019-10-28 DIAGNOSIS — Z681 Body mass index (BMI) 19 or less, adult: Secondary | ICD-10-CM | POA: Diagnosis not present

## 2019-10-28 DIAGNOSIS — Z7189 Other specified counseling: Secondary | ICD-10-CM | POA: Diagnosis not present

## 2019-10-28 DIAGNOSIS — E7800 Pure hypercholesterolemia, unspecified: Secondary | ICD-10-CM | POA: Diagnosis not present

## 2019-10-28 DIAGNOSIS — R5383 Other fatigue: Secondary | ICD-10-CM | POA: Diagnosis not present

## 2019-10-28 DIAGNOSIS — L439 Lichen planus, unspecified: Secondary | ICD-10-CM | POA: Diagnosis not present

## 2019-10-28 DIAGNOSIS — Z1339 Encounter for screening examination for other mental health and behavioral disorders: Secondary | ICD-10-CM | POA: Diagnosis not present

## 2019-10-28 DIAGNOSIS — Z79899 Other long term (current) drug therapy: Secondary | ICD-10-CM | POA: Diagnosis not present

## 2019-12-24 DIAGNOSIS — Z79899 Other long term (current) drug therapy: Secondary | ICD-10-CM | POA: Diagnosis not present

## 2019-12-24 DIAGNOSIS — L438 Other lichen planus: Secondary | ICD-10-CM | POA: Diagnosis not present

## 2020-01-07 DIAGNOSIS — I739 Peripheral vascular disease, unspecified: Secondary | ICD-10-CM | POA: Diagnosis not present

## 2020-01-07 DIAGNOSIS — M79671 Pain in right foot: Secondary | ICD-10-CM | POA: Diagnosis not present

## 2020-01-07 DIAGNOSIS — M79672 Pain in left foot: Secondary | ICD-10-CM | POA: Diagnosis not present

## 2020-01-07 DIAGNOSIS — L110 Acquired keratosis follicularis: Secondary | ICD-10-CM | POA: Diagnosis not present

## 2020-01-22 DIAGNOSIS — E7800 Pure hypercholesterolemia, unspecified: Secondary | ICD-10-CM | POA: Diagnosis not present

## 2020-01-22 DIAGNOSIS — J449 Chronic obstructive pulmonary disease, unspecified: Secondary | ICD-10-CM | POA: Diagnosis not present

## 2020-03-10 DIAGNOSIS — J449 Chronic obstructive pulmonary disease, unspecified: Secondary | ICD-10-CM | POA: Diagnosis not present

## 2020-03-10 DIAGNOSIS — E7800 Pure hypercholesterolemia, unspecified: Secondary | ICD-10-CM | POA: Diagnosis not present

## 2020-03-25 DIAGNOSIS — I739 Peripheral vascular disease, unspecified: Secondary | ICD-10-CM | POA: Diagnosis not present

## 2020-03-25 DIAGNOSIS — M79671 Pain in right foot: Secondary | ICD-10-CM | POA: Diagnosis not present

## 2020-03-25 DIAGNOSIS — L110 Acquired keratosis follicularis: Secondary | ICD-10-CM | POA: Diagnosis not present

## 2020-03-25 DIAGNOSIS — M79672 Pain in left foot: Secondary | ICD-10-CM | POA: Diagnosis not present

## 2020-03-31 DIAGNOSIS — L438 Other lichen planus: Secondary | ICD-10-CM | POA: Diagnosis not present

## 2020-03-31 DIAGNOSIS — Z79899 Other long term (current) drug therapy: Secondary | ICD-10-CM | POA: Diagnosis not present

## 2020-04-09 DIAGNOSIS — S93331D Other subluxation of right foot, subsequent encounter: Secondary | ICD-10-CM | POA: Diagnosis not present

## 2020-04-09 DIAGNOSIS — S93332D Other subluxation of left foot, subsequent encounter: Secondary | ICD-10-CM | POA: Diagnosis not present

## 2020-04-09 DIAGNOSIS — M779 Enthesopathy, unspecified: Secondary | ICD-10-CM | POA: Diagnosis not present

## 2020-04-09 DIAGNOSIS — M79672 Pain in left foot: Secondary | ICD-10-CM | POA: Diagnosis not present

## 2020-04-09 DIAGNOSIS — M79671 Pain in right foot: Secondary | ICD-10-CM | POA: Diagnosis not present

## 2020-04-10 ENCOUNTER — Other Ambulatory Visit: Payer: Self-pay

## 2020-04-10 ENCOUNTER — Encounter (INDEPENDENT_AMBULATORY_CARE_PROVIDER_SITE_OTHER): Payer: Medicare Other | Admitting: Ophthalmology

## 2020-04-10 DIAGNOSIS — H353134 Nonexudative age-related macular degeneration, bilateral, advanced atrophic with subfoveal involvement: Secondary | ICD-10-CM | POA: Diagnosis not present

## 2020-04-10 DIAGNOSIS — H43813 Vitreous degeneration, bilateral: Secondary | ICD-10-CM

## 2020-04-20 DIAGNOSIS — Z23 Encounter for immunization: Secondary | ICD-10-CM | POA: Diagnosis not present

## 2020-04-29 DIAGNOSIS — F32A Depression, unspecified: Secondary | ICD-10-CM | POA: Diagnosis not present

## 2020-04-29 DIAGNOSIS — Z299 Encounter for prophylactic measures, unspecified: Secondary | ICD-10-CM | POA: Diagnosis not present

## 2020-04-29 DIAGNOSIS — J449 Chronic obstructive pulmonary disease, unspecified: Secondary | ICD-10-CM | POA: Diagnosis not present

## 2020-04-29 DIAGNOSIS — F411 Generalized anxiety disorder: Secondary | ICD-10-CM | POA: Diagnosis not present

## 2020-04-29 DIAGNOSIS — Z681 Body mass index (BMI) 19 or less, adult: Secondary | ICD-10-CM | POA: Diagnosis not present

## 2020-04-29 DIAGNOSIS — C069 Malignant neoplasm of mouth, unspecified: Secondary | ICD-10-CM | POA: Diagnosis not present

## 2020-06-10 DIAGNOSIS — L110 Acquired keratosis follicularis: Secondary | ICD-10-CM | POA: Diagnosis not present

## 2020-06-10 DIAGNOSIS — M79671 Pain in right foot: Secondary | ICD-10-CM | POA: Diagnosis not present

## 2020-06-10 DIAGNOSIS — I739 Peripheral vascular disease, unspecified: Secondary | ICD-10-CM | POA: Diagnosis not present

## 2020-06-10 DIAGNOSIS — M79672 Pain in left foot: Secondary | ICD-10-CM | POA: Diagnosis not present

## 2020-06-10 DIAGNOSIS — M79674 Pain in right toe(s): Secondary | ICD-10-CM | POA: Diagnosis not present

## 2020-06-10 DIAGNOSIS — M79675 Pain in left toe(s): Secondary | ICD-10-CM | POA: Diagnosis not present

## 2020-07-06 DIAGNOSIS — L439 Lichen planus, unspecified: Secondary | ICD-10-CM | POA: Diagnosis not present

## 2020-07-06 DIAGNOSIS — Z79899 Other long term (current) drug therapy: Secondary | ICD-10-CM | POA: Diagnosis not present

## 2020-07-06 DIAGNOSIS — L438 Other lichen planus: Secondary | ICD-10-CM | POA: Diagnosis not present

## 2020-07-30 DIAGNOSIS — J45909 Unspecified asthma, uncomplicated: Secondary | ICD-10-CM | POA: Diagnosis not present

## 2020-07-30 DIAGNOSIS — F32A Depression, unspecified: Secondary | ICD-10-CM | POA: Diagnosis not present

## 2020-07-30 DIAGNOSIS — F411 Generalized anxiety disorder: Secondary | ICD-10-CM | POA: Diagnosis not present

## 2020-07-30 DIAGNOSIS — Z299 Encounter for prophylactic measures, unspecified: Secondary | ICD-10-CM | POA: Diagnosis not present

## 2020-07-30 DIAGNOSIS — G43909 Migraine, unspecified, not intractable, without status migrainosus: Secondary | ICD-10-CM | POA: Diagnosis not present

## 2020-08-19 DIAGNOSIS — I739 Peripheral vascular disease, unspecified: Secondary | ICD-10-CM | POA: Diagnosis not present

## 2020-08-19 DIAGNOSIS — M79672 Pain in left foot: Secondary | ICD-10-CM | POA: Diagnosis not present

## 2020-08-19 DIAGNOSIS — M79675 Pain in left toe(s): Secondary | ICD-10-CM | POA: Diagnosis not present

## 2020-08-19 DIAGNOSIS — M79671 Pain in right foot: Secondary | ICD-10-CM | POA: Diagnosis not present

## 2020-08-19 DIAGNOSIS — L110 Acquired keratosis follicularis: Secondary | ICD-10-CM | POA: Diagnosis not present

## 2020-08-19 DIAGNOSIS — M79674 Pain in right toe(s): Secondary | ICD-10-CM | POA: Diagnosis not present

## 2020-08-31 DIAGNOSIS — N814 Uterovaginal prolapse, unspecified: Secondary | ICD-10-CM | POA: Diagnosis not present

## 2020-08-31 DIAGNOSIS — Z681 Body mass index (BMI) 19 or less, adult: Secondary | ICD-10-CM | POA: Diagnosis not present

## 2020-08-31 DIAGNOSIS — R350 Frequency of micturition: Secondary | ICD-10-CM | POA: Diagnosis not present

## 2020-08-31 DIAGNOSIS — J449 Chronic obstructive pulmonary disease, unspecified: Secondary | ICD-10-CM | POA: Diagnosis not present

## 2020-08-31 DIAGNOSIS — Z87891 Personal history of nicotine dependence: Secondary | ICD-10-CM | POA: Diagnosis not present

## 2020-08-31 DIAGNOSIS — Z299 Encounter for prophylactic measures, unspecified: Secondary | ICD-10-CM | POA: Diagnosis not present

## 2020-08-31 DIAGNOSIS — N390 Urinary tract infection, site not specified: Secondary | ICD-10-CM | POA: Diagnosis not present

## 2020-10-05 DIAGNOSIS — L905 Scar conditions and fibrosis of skin: Secondary | ICD-10-CM | POA: Diagnosis not present

## 2020-10-05 DIAGNOSIS — Z79899 Other long term (current) drug therapy: Secondary | ICD-10-CM | POA: Diagnosis not present

## 2020-10-05 DIAGNOSIS — Z85828 Personal history of other malignant neoplasm of skin: Secondary | ICD-10-CM | POA: Diagnosis not present

## 2020-10-05 DIAGNOSIS — L438 Other lichen planus: Secondary | ICD-10-CM | POA: Diagnosis not present

## 2020-10-06 DIAGNOSIS — L905 Scar conditions and fibrosis of skin: Secondary | ICD-10-CM | POA: Diagnosis not present

## 2020-10-06 DIAGNOSIS — L438 Other lichen planus: Secondary | ICD-10-CM | POA: Diagnosis not present

## 2020-10-30 ENCOUNTER — Ambulatory Visit: Payer: Medicare Other | Admitting: Urology

## 2020-11-05 DIAGNOSIS — Z1331 Encounter for screening for depression: Secondary | ICD-10-CM | POA: Diagnosis not present

## 2020-11-05 DIAGNOSIS — Z79899 Other long term (current) drug therapy: Secondary | ICD-10-CM | POA: Diagnosis not present

## 2020-11-05 DIAGNOSIS — Z1339 Encounter for screening examination for other mental health and behavioral disorders: Secondary | ICD-10-CM | POA: Diagnosis not present

## 2020-11-05 DIAGNOSIS — Z7189 Other specified counseling: Secondary | ICD-10-CM | POA: Diagnosis not present

## 2020-11-05 DIAGNOSIS — E7800 Pure hypercholesterolemia, unspecified: Secondary | ICD-10-CM | POA: Diagnosis not present

## 2020-11-05 DIAGNOSIS — R5383 Other fatigue: Secondary | ICD-10-CM | POA: Diagnosis not present

## 2020-11-05 DIAGNOSIS — Z0000 Encounter for general adult medical examination without abnormal findings: Secondary | ICD-10-CM | POA: Diagnosis not present

## 2020-11-05 DIAGNOSIS — Z299 Encounter for prophylactic measures, unspecified: Secondary | ICD-10-CM | POA: Diagnosis not present

## 2020-11-05 DIAGNOSIS — Z681 Body mass index (BMI) 19 or less, adult: Secondary | ICD-10-CM | POA: Diagnosis not present

## 2020-11-11 DIAGNOSIS — I739 Peripheral vascular disease, unspecified: Secondary | ICD-10-CM | POA: Diagnosis not present

## 2020-11-11 DIAGNOSIS — M79671 Pain in right foot: Secondary | ICD-10-CM | POA: Diagnosis not present

## 2020-11-11 DIAGNOSIS — M79672 Pain in left foot: Secondary | ICD-10-CM | POA: Diagnosis not present

## 2020-11-11 DIAGNOSIS — M79675 Pain in left toe(s): Secondary | ICD-10-CM | POA: Diagnosis not present

## 2020-11-11 DIAGNOSIS — L110 Acquired keratosis follicularis: Secondary | ICD-10-CM | POA: Diagnosis not present

## 2020-11-11 DIAGNOSIS — M79674 Pain in right toe(s): Secondary | ICD-10-CM | POA: Diagnosis not present

## 2020-12-10 DIAGNOSIS — Z681 Body mass index (BMI) 19 or less, adult: Secondary | ICD-10-CM | POA: Diagnosis not present

## 2020-12-10 DIAGNOSIS — Z299 Encounter for prophylactic measures, unspecified: Secondary | ICD-10-CM | POA: Diagnosis not present

## 2020-12-10 DIAGNOSIS — K120 Recurrent oral aphthae: Secondary | ICD-10-CM | POA: Diagnosis not present

## 2020-12-10 DIAGNOSIS — C069 Malignant neoplasm of mouth, unspecified: Secondary | ICD-10-CM | POA: Diagnosis not present

## 2020-12-10 DIAGNOSIS — J449 Chronic obstructive pulmonary disease, unspecified: Secondary | ICD-10-CM | POA: Diagnosis not present

## 2020-12-11 ENCOUNTER — Ambulatory Visit: Payer: Medicare Other | Admitting: Urology

## 2021-01-04 DIAGNOSIS — L439 Lichen planus, unspecified: Secondary | ICD-10-CM | POA: Diagnosis not present

## 2021-01-05 DIAGNOSIS — L438 Other lichen planus: Secondary | ICD-10-CM | POA: Diagnosis not present

## 2021-01-05 DIAGNOSIS — L905 Scar conditions and fibrosis of skin: Secondary | ICD-10-CM | POA: Diagnosis not present

## 2021-01-05 DIAGNOSIS — Z85828 Personal history of other malignant neoplasm of skin: Secondary | ICD-10-CM | POA: Diagnosis not present

## 2021-01-29 DIAGNOSIS — Z299 Encounter for prophylactic measures, unspecified: Secondary | ICD-10-CM | POA: Diagnosis not present

## 2021-01-29 DIAGNOSIS — R519 Headache, unspecified: Secondary | ICD-10-CM | POA: Diagnosis not present

## 2021-02-03 DIAGNOSIS — I739 Peripheral vascular disease, unspecified: Secondary | ICD-10-CM | POA: Diagnosis not present

## 2021-02-03 DIAGNOSIS — L110 Acquired keratosis follicularis: Secondary | ICD-10-CM | POA: Diagnosis not present

## 2021-02-03 DIAGNOSIS — M79671 Pain in right foot: Secondary | ICD-10-CM | POA: Diagnosis not present

## 2021-02-03 DIAGNOSIS — M79675 Pain in left toe(s): Secondary | ICD-10-CM | POA: Diagnosis not present

## 2021-02-03 DIAGNOSIS — M79672 Pain in left foot: Secondary | ICD-10-CM | POA: Diagnosis not present

## 2021-02-03 DIAGNOSIS — M79674 Pain in right toe(s): Secondary | ICD-10-CM | POA: Diagnosis not present

## 2021-02-08 DIAGNOSIS — K099 Cyst of oral region, unspecified: Secondary | ICD-10-CM | POA: Diagnosis not present

## 2021-02-09 DIAGNOSIS — L570 Actinic keratosis: Secondary | ICD-10-CM | POA: Diagnosis not present

## 2021-02-09 DIAGNOSIS — K1379 Other lesions of oral mucosa: Secondary | ICD-10-CM | POA: Diagnosis not present

## 2021-02-15 ENCOUNTER — Other Ambulatory Visit: Payer: Self-pay | Admitting: Otolaryngology

## 2021-02-15 DIAGNOSIS — D490 Neoplasm of unspecified behavior of digestive system: Secondary | ICD-10-CM

## 2021-02-17 ENCOUNTER — Other Ambulatory Visit (HOSPITAL_COMMUNITY): Payer: Self-pay | Admitting: Otolaryngology

## 2021-02-17 DIAGNOSIS — D490 Neoplasm of unspecified behavior of digestive system: Secondary | ICD-10-CM

## 2021-02-19 ENCOUNTER — Ambulatory Visit (HOSPITAL_COMMUNITY)
Admission: RE | Admit: 2021-02-19 | Discharge: 2021-02-19 | Disposition: A | Discharge status: Home / Self Care | Payer: Medicare Other | Source: Ambulatory Visit | Attending: Otolaryngology | Admitting: Otolaryngology

## 2021-02-19 DIAGNOSIS — D490 Neoplasm of unspecified behavior of digestive system: Secondary | ICD-10-CM

## 2021-02-19 DIAGNOSIS — C069 Malignant neoplasm of mouth, unspecified: Secondary | ICD-10-CM | POA: Diagnosis not present

## 2021-02-23 ENCOUNTER — Other Ambulatory Visit (HOSPITAL_COMMUNITY): Payer: Medicare Other | Admitting: Dentistry

## 2021-03-01 ENCOUNTER — Other Ambulatory Visit: Payer: Self-pay

## 2021-03-01 ENCOUNTER — Ambulatory Visit (INDEPENDENT_AMBULATORY_CARE_PROVIDER_SITE_OTHER): Payer: Medicare Other | Admitting: Dentistry

## 2021-03-01 DIAGNOSIS — K045 Chronic apical periodontitis: Secondary | ICD-10-CM

## 2021-03-01 DIAGNOSIS — K0889 Other specified disorders of teeth and supporting structures: Secondary | ICD-10-CM

## 2021-03-01 DIAGNOSIS — K036 Deposits [accretions] on teeth: Secondary | ICD-10-CM

## 2021-03-01 DIAGNOSIS — Z01818 Encounter for other preprocedural examination: Secondary | ICD-10-CM

## 2021-03-01 DIAGNOSIS — K08109 Complete loss of teeth, unspecified cause, unspecified class: Secondary | ICD-10-CM

## 2021-03-01 DIAGNOSIS — K1379 Other lesions of oral mucosa: Secondary | ICD-10-CM | POA: Diagnosis not present

## 2021-03-01 DIAGNOSIS — K0820 Unspecified atrophy of edentulous alveolar ridge: Secondary | ICD-10-CM

## 2021-03-01 DIAGNOSIS — F40232 Fear of other medical care: Secondary | ICD-10-CM | POA: Diagnosis not present

## 2021-03-01 DIAGNOSIS — K0530 Chronic periodontitis, unspecified: Secondary | ICD-10-CM

## 2021-03-01 DIAGNOSIS — K06020 Generalized gingival recession, unspecified: Secondary | ICD-10-CM

## 2021-03-01 NOTE — Progress Notes (Signed)
Department of Dental Medicine      OUTPATIENT CONSULT  Service Date:   03/01/2021  Patient Name:   Meredith Stewart Date of Birth:   Mar 16, 1941 Medical Record Number: [id number]  Referring Provider:                [name], M.D.        TODAY'S VISIT:   Assessment:   There are no current signs of acute odontogenic infection including abscess, edema or erythema, or suspicious lesion requiring biopsy.   There are several lower anterior teeth that are mobile and generalized bone loss/calculus build-up.  Recommendations:   Extractions of teeth #22-#27 and potentially premolars pending medical team's recommendations. Plan:   Discuss case with medical team and coordinate treatment as needed.  Discussed in detail all treatment options and recommendations with the patient and they are agreeable to the plan.    Thank you for consulting with [facility] and for the opportunity to participate in this patient's treatment.  Should you have any questions or concerns, please contact the [facility] at [phone].        PROGRESS NOTE:   COVID-19 SCREENING:  The patient denies symptoms concerning for COVID-19 infection including fever, chills, cough, or newly developed shortness of breath.   HISTORY OF PRESENT ILLNESS: Meredith Stewart is a very pleasant 80 y.o. female with h/o asthma, tobacco use, GERD, hyperlipidemia, mitral valve prolapse and SCC of the buccal mucosa in 2012 who was recently seen by Dr. Constance Holster for an appointment and biopsy of suspicious growth of tissue in the anterior mandible.  She was subsequently referred to our clinic for evaluation and possible treatment if further biopsying or removal of tissue/bone is required by Dr. Constance Holster so dental and ENT can complete procedures together in the OR.  The patient presents today for a medically necessary dental consultation to evaluate teeth in the area of concern for extractions.   DENTAL HISTORY: The patient  reports that her last dental visit was not that long ago (maybe a month or two).  She reports having a dentist that she sees regularly for cleanings and exams, but since her last visit she has had a very loose tooth on the bottom in the front that has been very aggravating and she wishes it would just "fall out" already.  She states that the tissue where she has some excess growth was biopsied by Dr. Constance Holster and results came back as no malignancy, however she is waiting on dental evaluation to determine next steps with him.  She currently denies any dental/orofacial pain or sensitivity. Patient is able to manage oral secretions.  Patient denies dysphagia, odynophagia, dysphonia, SOB and neck pain.  Patient denies fever, rigors and malaise.   CHIEF COMPLAINT:  Here for a preoperative dental exam.   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   Tobacco abuse    smoked x 35 years, quit 12 years ago   Wears glasses    Past Surgical History:  Procedure Laterality Date   ABDOMINAL HYSTERECTOMY  09/19/2018   CARDIAC CATHETERIZATION     10/13   DILATION AND CURETTAGE OF UTERUS     EXCISION OF ORAL TUMOR  4/12   rt bucca-squamus cell ca   EXCISION OF TONGUE LESION WITH LASER N/A 01/09/2013   Procedure: ORAL BIOPSY AND LASER ABLATION;  Surgeon: [name], MD;  Location: [place];  Service: ENT;  Laterality: N/A;   EXCISION ORAL LESION WITH CO2 LASER Right 11/09/2015   Procedure: EXCISION ORAL LESION WITH CO2 LASER AND FROZEN SECTION ;  Surgeon: [name], MD;  Location: [place];   Service: ENT;  Laterality: Right;   EXCISION ORAL LESION WITH CO2 LASER N/A 08/13/2018   Procedure: EXCISION/biopsy ORAL LESION WITH CO2 LASER ablation;  Surgeon: [name], MD;  Location: [place];  Service: ENT;  Laterality: N/A;   EXCISION ORAL LESION WITH CO2 LASER N/A 11/20/2018   Procedure: EXCISION ORAL LESION WITH CO2 LASER;  Surgeon: [name], MD;  Location: [place];  Service: ENT;  Laterality: N/A;   EXCISION ORAL TUMOR N/A 05/08/2019   Procedure: EXCISION ORAL CANCER WITH DEBRIDEMENT OF MAXILLARY BONE;  Surgeon: [name], MD;  Location: [place];  Service: ENT;  Laterality: N/A;   EYE SURGERY     [facility] surgeries   LEFT HEART CATHETERIZATION WITH CORONARY ANGIOGRAM N/A 05/18/2012   Procedure: LEFT HEART CATHETERIZATION WITH CORONARY ANGIOGRAM;  Surgeon: Josue Hector, MD;  Location: [facility];  Service: Cardiovascular;  Laterality: N/A;   SKIN FULL THICKNESS GRAFT Right 11/23/2018   Procedure: SKIN GRAFT FULL THICKNESS;  Surgeon: [name], MD;  Location: [place];  Service: ENT;  Laterality: Right;   Allergies  Allergen Reactions   Erythromycin Hives   Nitroglycerin Other (See Comments)    Severe drop in b/p   Prednisone     Feet and Hands burn   Sulfa Antibiotics     unknown   Current Outpatient Medications  Medication Sig Dispense Refill   acetaminophen (TYLENOL) 500 MG tablet Take 500 mg by mouth every 6 (six) hours as needed for headache.     albuterol (PROVENTIL HFA;VENTOLIN HFA) 108 (90 BASE) MCG/ACT inhaler Inhale 1 puff into the lungs See admin instructions. Inhale 1 puff daily, may inhale 1 puff every 4 to 6 hours as needed for shortness of breath     calcium carbonate (OSCAL) 1500 (600 Ca) MG TABS tablet Take 600 mg of elemental calcium by mouth at bedtime.      Cholecalciferol (VITAMIN D) 2000 UNITS tablet Take 2,000 Units by mouth daily with supper.      clindamycin (CLEOCIN) 300 MG capsule Take 1 capsule (300 mg total) by mouth 3  (three) times daily. 15 capsule 0   clobetasol cream (TEMOVATE) AB-123456789 % Apply 1 application topically daily. Lower left leg     clonazePAM (KLONOPIN) 0.5 MG tablet Take 0.25 mg by mouth See admin instructions. Take 0.25 mg by mouth at night, may take an additional 0.25 mg as needed for anxiety     ferrous sulfate 325 (65 FE) MG tablet Take 325 mg by mouth at bedtime.     fluocinonide ointment (LIDEX) [id number] % Apply 1 application topically See admin instructions. Apply a thin layer to oral lesions 4 - 6 times daily as needed for mouth sores     Fluticasone-Salmeterol (ADVAIR) 250-50 MCG/DOSE AEPB Inhale 1 puff into the lungs daily. Inhale 1 puff daily, may inhale a second dose as needed for  shortness of breath     HYDROcodone-acetaminophen (NORCO) 7.5-325 MG tablet Take 1 tablet by mouth every 6 (six) hours as needed for moderate pain. 20 tablet 0   HYDROcodone-acetaminophen (NORCO/VICODIN) 5-325 MG tablet Take 0.25 tablets by mouth 2 (two) times daily as needed for moderate pain.     multivitamin-lutein (OCUVITE-LUTEIN) CAPS Take 1 capsule by mouth every evening.      pantoprazole (PROTONIX) 40 MG tablet Take 40 mg by mouth daily.     pravastatin (PRAVACHOL) 20 MG tablet Take 20 mg by mouth daily with supper.      promethazine (PHENERGAN) 25 MG suppository Place 1 suppository (25 mg total) rectally every 6 (six) hours as needed for nausea or vomiting. 12 suppository 1   No current facility-administered medications for this visit.    LABS: Lab Results  Component Value Date   WBC 5.1 05/08/2019   HGB 13.7 05/08/2019   HCT 41.3 05/08/2019   MCV 95.2 05/08/2019   PLT 264 05/08/2019      Component Value Date/Time   NA 132 (L) 05/08/2019 0845   K 4.1 05/08/2019 0845   CL 95 (L) 05/08/2019 0845   CO2 25 05/08/2019 0845   GLUCOSE 99 05/08/2019 0845   BUN 13 05/08/2019 0845   CREATININE 0.72 05/08/2019 0845   CALCIUM 9.8 05/08/2019 0845   GFRNONAA >60 05/08/2019 0845   GFRAA >60 05/08/2019  0845   Lab Results  Component Value Date   INR 1.0 11/23/2018   INR 0.96 05/17/2012   No results found for: PTT  Social History   Socioeconomic History   Marital status: Widowed    Spouse name: Not on file   Number of children: Not on file   Years of education: Not on file   Highest education level: Not on file  Occupational History   Not on file  Tobacco Use   Smoking status: Former    Packs/day: 1.00    Years: 35.00    Pack years: 35.00    Types: Cigarettes    Quit date: 07/26/1999    Years since quitting: 21.6   Smokeless tobacco: Never  Vaping Use   Vaping Use: Never used  Substance and Sexual Activity   Alcohol use: No   Drug use: No   Sexual activity: Not on file  Other Topics Concern   Not on file  Social History Narrative   Not on file   Social Determinants of Health   Financial Resource Strain: Not on file  Food Insecurity: Not on file  Transportation Needs: Not on file  Physical Activity: Not on file  Stress: Not on file  Social Connections: Not on file  Intimate Partner Violence: Not on file   No family history on file.   REVIEW OF SYSTEMS:  Reviewed with the patient as per HPI. Psych:  (+) Dental phobia   VITAL SIGNS: BP (!) 152/76 (BP Location: Right Arm, Patient Position: Sitting, Cuff Size: Normal)   Pulse 75   Temp 98.4 F (36.9 C) (Oral)    PHYSICAL EXAM: General:  Well-developed, comfortable and in no apparent distress. Neurological:  Alert and oriented to person, place and  time. Extraoral:  Facial symmetry present without any edema or erythema.  No swelling or lymphadenopathy.  TMJ asymptomatic without clicks or crepitations. Intraoral:  Soft tissues appear well-perfused and mucous membranes moist.  FOM and vestibules soft and not raised. No signs of infection, parulis, sinus tract, edema or erythema evident upon exam. (+) Oral cavity-  Tissue overgrowth in the anterior mandible at midline on buccal and lingual surrounding teeth  #22-#27; irregularity in tissue and discoloration.   DENTAL EXAM: Hard tissue exam completed and charted. Overall impression:  Poor remaining dentition.  Oral hygiene:  Poor    Periodontal:  Inflamed and erythematous gingival tissue. Generalized plaque and calculus accumulation.  Generalized gingival recession. Mobility- Class I #20, #22, Class II #13, #26, #27, #28, Class III #23, #24. Caries:  No clinical caries noted. Removable/fixed prosthodontics:  Patient denies wearing partial dentures. #5 has PFM crown. Occlusion:  Unable to assess molar occlusion.  Non-functional tooth #5. Other findings:  Attrition/wear- #6-#11 incisal. Severe loss of maxillary and mandibular alveolar bone in edentulous regions.   RADIOGRAPHIC EXAM:  Orthopanogram (taken on 02/19/21) interpreted and Full Mouth Series exposed and interpreted.  Condyles seated bilaterally in fossas.  No evidence of abnormal pathology.  All visualized osseous structures appear WNL. Small radiopacity evident in the left maxillary sinus border along maxilla- possible retained root tip vs previous infection. Severe loss of alveolar ridges in edentulous regions in maxilla and mandible.  Generalized moderate to severe horizontal bone loss consistent with moderate to severe periodontitis.  Lower anterior teeth have severe horizontal bone loss and lower incisors have no surrounding bone and are being held in place by soft tissue only. Radiographic calculus accumulation evident.  Missing teeth, existing restorations, #5 full-coverage crown with possible open mesial margin vs ledge.     ASSESSMENT:  1.  Oral tumor 2.  Preoperative dental exam 3.  H/o SCC of buccal mucosa 4.  Accretions on teeth 5.  Loose teeth 6.  Chronic periodontitis 7.  Chronic apical periodontitis 8.  Gingival recession, generalized 9.  Atrophy of edentulous alveolar ridge 10.  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  I explained that if any chronic or acute dental/oral infection(s) are addressed and subsequently not maintained following medical optimization and recovery, their risk of the previously mentioned complications are just as high and could potentially occur.  I explained all significant findings of the dental consultation with the patient including several extremely loose teeth that are only being held in place by her gums (primarily lower anterior teeth), periodontal disease and significant generalized bone loss and calculus or tartar build-up on teeth, and the recommended care including extractions of all hopeless teeth which includes lower teeth numbers 22, 23, 24, 26 and 27 and possibly the remaining mandibular teeth if this is indicated due to her oral tumor.  I explained that this will be discussed with her medical team (Dr. Constance Holster) and we will definitively decide on treatment plan and next steps pending any additional recommendations. The patient verbalized understanding of all findings, discussion, and recommendations. We then discussed various treatment options to include no treatment, multiple extractions with alveoloplasty, pre-prosthetic surgery as indicated, periodontal therapy, dental restorations, root canal therapy, crown and bridge therapy, implant therapy, and replacement of missing teeth as indicated.  The patient verbalized understanding of all options, and currently wishes to proceed with extractions of all indicated teeth as discussed. Plan to discuss all findings and recommendations with medical team and coordinate future care as needed.  All questions and concerns were invited and addressed.  The patient tolerated today's visit  well and  departed in stable condition.   I spent in excess of 120 minutes during the conduct of this consultation and >50% of this time involved direct face-to-face encounter for counseling and/or coordination of the patient's care.  [place] Benson Norway, D.M.D.

## 2021-03-10 DIAGNOSIS — M79672 Pain in left foot: Secondary | ICD-10-CM | POA: Diagnosis not present

## 2021-03-10 DIAGNOSIS — M79674 Pain in right toe(s): Secondary | ICD-10-CM | POA: Diagnosis not present

## 2021-03-10 DIAGNOSIS — I739 Peripheral vascular disease, unspecified: Secondary | ICD-10-CM | POA: Diagnosis not present

## 2021-03-10 DIAGNOSIS — M79675 Pain in left toe(s): Secondary | ICD-10-CM | POA: Diagnosis not present

## 2021-03-10 DIAGNOSIS — L110 Acquired keratosis follicularis: Secondary | ICD-10-CM | POA: Diagnosis not present

## 2021-03-10 DIAGNOSIS — M79671 Pain in right foot: Secondary | ICD-10-CM | POA: Diagnosis not present

## 2021-03-15 NOTE — H&P (View-Only) (Signed)
Meredith Stewart is an 80 y.o. female.   Chief Complaint: Oral lesion HPI: History of oral cancer in the past has had multiple treatments.  New onset of a destructive mass involving the lower anterior mandibular alveolar region with loose teeth.  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   Tobacco abuse    smoked x 35 years, quit 12 years ago   Wears glasses     Past Surgical History:  Procedure Laterality Date   ABDOMINAL HYSTERECTOMY  09/19/2018   CARDIAC CATHETERIZATION     10/13   DILATION AND CURETTAGE OF UTERUS     EXCISION OF ORAL TUMOR  4/12   rt bucca-squamus cell ca   EXCISION OF TONGUE LESION WITH LASER N/A 01/09/2013   Procedure: ORAL BIOPSY AND LASER ABLATION;  Surgeon: [name], MD;  Location: [place];  Service: ENT;  Laterality: N/A;   EXCISION ORAL LESION WITH CO2 LASER Right 11/09/2015   Procedure: EXCISION ORAL LESION WITH CO2 LASER AND FROZEN SECTION ;  Surgeon: [name], MD;  Location: [place];  Service: ENT;  Laterality: Right;   EXCISION ORAL LESION WITH CO2 LASER N/A 08/13/2018   Procedure: EXCISION/biopsy ORAL LESION WITH CO2 LASER ablation;  Surgeon: [name], MD;  Location: [place];  Service: ENT;  Laterality: N/A;   EXCISION ORAL LESION WITH CO2 LASER N/A 11/20/2018   Procedure: EXCISION ORAL LESION WITH CO2 LASER;  Surgeon: [name], MD;  Location: [place];  Service: ENT;  Laterality: N/A;   EXCISION ORAL TUMOR N/A 05/08/2019   Procedure: EXCISION ORAL CANCER WITH DEBRIDEMENT OF MAXILLARY BONE;  Surgeon: [name], MD;  Location: [place];  Service: ENT;  Laterality: N/A;   EYE SURGERY     [facility] surgeries   LEFT HEART CATHETERIZATION WITH CORONARY ANGIOGRAM N/A 05/18/2012   Procedure: LEFT HEART CATHETERIZATION WITH CORONARY ANGIOGRAM;   Surgeon: Josue Hector, MD;  Location: [facility] CATH LAB;  Service: Cardiovascular;  Laterality: N/A;   SKIN FULL THICKNESS GRAFT Right 11/23/2018   Procedure: SKIN GRAFT FULL THICKNESS;  Surgeon: [name], MD;  Location: [place];  Service: ENT;  Laterality: Right;    No family history on file. Social History:  reports that she quit smoking about 21 years ago. Her smoking use included cigarettes. She has a 35.00 pack-year smoking history. She has never used smokeless tobacco. She reports that she does not drink alcohol and does not use drugs.  Allergies:  Allergies  Allergen Reactions   Doxycycline     Other reaction(s): Other (See Comments) Gets shaky all over   Erythromycin Hives   Nitroglycerin Other (See Comments)    Severe drop in b/p   Prednisone     Feet and Hands burn   Sulfa Antibiotics     Avoid due to macular degeneration    No medications prior to admission.    No results found for this or any previous visit (from the past 48 hour(s)). No results found.  ROS: otherwise negative  There were no vitals taken for this visit.  PHYSICAL EXAM: Overall appearance:  Healthy appearing, in no distress Head:  Normocephalic, atraumatic. Ears: External auditory canals are clear; tympanic membranes are intact and the middle ears are free of any effusion. Nose: External nose  is healthy in appearance. Internal nasal exam free of any lesions or obstruction. Oral Cavity/pharynx:  3-4 cm exophytic mass involving the anterior mandibular alveolus with loose dentition. Hypopharynx/Larynx: no signs of any mucosal lesions or masses identified. Vocal cords move normally. Neuro:  No identifiable neurologic deficits. Neck: No palpable neck masses.  Studies Reviewed: none    Assessment/Plan Recommend resection of the anterior mandibular alveolus with tooth extraction in conjunction with Dr. Benson Norway.  [name] 03/15/2021, 3:51 PM

## 2021-03-15 NOTE — H&P (Signed)
Meredith Stewart is an 80 y.o. female.   Chief Complaint: Oral lesion HPI: History of oral cancer in the past has had multiple treatments.  New onset of a destructive mass involving the lower anterior mandibular alveolar region with loose teeth.  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   Tobacco abuse    smoked x 35 years, quit 12 years ago   Wears glasses     Past Surgical History:  Procedure Laterality Date   ABDOMINAL HYSTERECTOMY  09/19/2018   CARDIAC CATHETERIZATION     10/13   DILATION AND CURETTAGE OF UTERUS     EXCISION OF ORAL TUMOR  4/12   rt bucca-squamus cell ca   EXCISION OF TONGUE LESION WITH LASER N/A 01/09/2013   Procedure: ORAL BIOPSY AND LASER ABLATION;  Surgeon: [name], MD;  Location: [place];  Service: ENT;  Laterality: N/A;   EXCISION ORAL LESION WITH CO2 LASER Right 11/09/2015   Procedure: EXCISION ORAL LESION WITH CO2 LASER AND FROZEN SECTION ;  Surgeon: [name], MD;  Location: [place];  Service: ENT;  Laterality: Right;   EXCISION ORAL LESION WITH CO2 LASER N/A 08/13/2018   Procedure: EXCISION/biopsy ORAL LESION WITH CO2 LASER ablation;  Surgeon: [name], MD;  Location: [place];  Service: ENT;  Laterality: N/A;   EXCISION ORAL LESION WITH CO2 LASER N/A 11/20/2018   Procedure: EXCISION ORAL LESION WITH CO2 LASER;  Surgeon: [name], MD;  Location: [place];  Service: ENT;  Laterality: N/A;   EXCISION ORAL TUMOR N/A 05/08/2019   Procedure: EXCISION ORAL CANCER WITH DEBRIDEMENT OF MAXILLARY BONE;  Surgeon: [name], MD;  Location: [place];  Service: ENT;  Laterality: N/A;   EYE SURGERY     [facility] eye surgeries   LEFT HEART CATHETERIZATION WITH CORONARY ANGIOGRAM N/A 05/18/2012   Procedure: LEFT HEART CATHETERIZATION WITH CORONARY ANGIOGRAM;   Surgeon: Josue Hector, MD;  Location[facility] C[facility] LAB;  Service: Cardiovascular;  Laterality: N/A;   SKIN FULL THICKNESS GRAFT Right 11/23/2018   Procedure: SKIN GRAFT FULL THICKNESS;  Surgeon: [name], MD;  Location: [place];  Service: ENT;  Laterality: Right;    No family history on file. Social History:  reports that she quit smoking about 21 years ago. Her smoking use included cigarettes. She has a 35.00 pack-year smoking history. She has never used smokeless tobacco. She reports that she does not drink alcohol and does not use drugs.  Allergies:  Allergies  Allergen Reactions   Doxycycline     Other reaction(s): Other (See Comments) Gets shaky all over   Erythromycin Hives   Nitroglycerin Other (See Comments)    Severe drop in b/p   Prednisone     Feet and Hands burn   Sulfa Antibiotics     Avoid due to macular degeneration    No medications prior to admission.    No results found for this or any previous visit (from the past 48 hour(s)). No results found.  ROS: otherwise negative  There were no vitals taken for this visit.  PHYSICAL EXAM: Overall appearance:  Healthy appearing, in no distress Head:  Normocephalic, atraumatic. Ears: External auditory canals are clear; tympanic membranes are intact and the middle ears are free of any effusion. Nose: External nose  is healthy in appearance. Internal nasal exam free of any lesions or obstruction. Oral Cavity/pharynx:  3-4 cm exophytic mass involving the anterior mandibular alveolus with loose dentition. Hypopharynx/Larynx: no signs of any mucosal lesions or masses identified. Vocal cords move normally. Neuro:  No identifiable neurologic deficits. Neck: No palpable neck masses.  Studies Reviewed: none    Assessment/Plan Recommend resection of the anterior mandibular alveolus with tooth extraction in conjunction with Dr. Benson Norway.  [name] 03/15/2021, 3:51 PM

## 2021-03-16 ENCOUNTER — Other Ambulatory Visit: Payer: Self-pay

## 2021-03-16 ENCOUNTER — Ambulatory Visit (HOSPITAL_COMMUNITY)
Admission: RE | Admit: 2021-03-16 | Discharge: 2021-03-16 | Disposition: A | Discharge status: Home / Self Care | Payer: Medicare Other | Source: Ambulatory Visit | Attending: Otolaryngology | Admitting: Otolaryngology

## 2021-03-16 DIAGNOSIS — K110 Atrophy of salivary gland: Secondary | ICD-10-CM | POA: Diagnosis not present

## 2021-03-16 DIAGNOSIS — D490 Neoplasm of unspecified behavior of digestive system: Secondary | ICD-10-CM | POA: Diagnosis not present

## 2021-03-16 DIAGNOSIS — I771 Stricture of artery: Secondary | ICD-10-CM | POA: Diagnosis not present

## 2021-03-16 DIAGNOSIS — R590 Localized enlarged lymph nodes: Secondary | ICD-10-CM | POA: Diagnosis not present

## 2021-03-16 LAB — POCT I-STAT CREATININE: Creatinine, Ser: 0.8 mg/dL (ref 0.44–1.00)

## 2021-03-16 MED ORDER — IOHEXOL 350 MG/ML SOLN
75.0000 mL | Freq: Once | INTRAVENOUS | Status: AC | PRN
  Administered 2021-03-16: 75 mL via INTRAVENOUS

## 2021-03-16 NOTE — Pre-Procedure Instructions (Signed)
Surgical Instructions    Your procedure is scheduled on Friday, August 26th, 2022.  Report to [facility] Main Entrance "A" at 07:00 A.M., then check in with the Admitting office.  Call this number if you have problems the morning of surgery:  [phone]   If you have any questions prior to your surgery date call [phone]: Open Monday-Friday 8am-4pm    Remember:  Do not eat or drink after midnight the night before your surgery    Take these medicines the morning of surgery with A SIP OF WATER: amoxicillin (AMOXIL) clonazePAM (KLONOPIN) diphenhydrAMINE (BENADRYL)  Fluticasone-Salmeterol (ADVAIR)  pantoprazole (PROTONIX)     Take these medications the morning of surgery AS NEEDED: acetaminophen (TYLENOL) albuterol (PROVENTIL HFA;VENTOLIN HFA) Polyethyl Glycol-Propyl Glycol (SYSTANE OP)   As of today, STOP taking any Aspirin (unless otherwise instructed by your surgeon) Aleve, Naproxen, Ibuprofen, Motrin, Advil, Goody's, BC's, all herbal medications, fish oil, and all vitamins.          Do not wear jewelry or makeup Do not wear lotions, powders, perfumes/colognes, or deodorant. Do not shave 48 hours prior to surgery.  Do not bring valuables to the hospital. DO Not wear nail polish, gel polish, artificial nails, or any other type of covering on  natural nails including finger and toenails. If patients have artificial nails, gel coating, etc. that need to be removed by a nail salon please have this removed prior to surgery or surgery may need to be canceled/delayed if the surgeon/ anesthesia feels like the patient is unable to be adequately monitored.             [facility] is not responsible for any belongings or valuables.  Do NOT Smoke (Tobacco/Vaping) or drink Alcohol 24 hours prior to your procedure If you use a CPAP at night, you may bring all equipment for your overnight stay.   Contacts, glasses, dentures or bridgework may not be worn into surgery, please bring  cases for these belongings   For patients admitted to the hospital, discharge time will be determined by your treatment team.   Patients discharged the day of surgery will not be allowed to drive home, and someone needs to stay with them for 24 hours.  ONLY 1 SUPPORT PERSON MAY BE PRESENT WHILE YOU ARE IN SURGERY. IF YOU ARE TO BE ADMITTED ONCE YOU ARE IN YOUR ROOM YOU WILL BE ALLOWED TWO (2) VISITORS.  Minor children may have two parents present. Special consideration for safety and communication needs will be reviewed on a case by case basis.  Special instructions:    Oral Hygiene is also important to reduce your risk of infection.  Remember - BRUSH YOUR TEETH THE MORNING OF SURGERY WITH YOUR REGULAR TOOTHPASTE   [place]- Preparing For Surgery  Before surgery, you can play an important role. Because skin is not sterile, your skin needs to be as free of germs as possible. You can reduce the number of germs on your skin by washing with CHG (chlorahexidine gluconate) Soap before surgery.  CHG is an antiseptic cleaner which kills germs and bonds with the skin to continue killing germs even after washing.     Please do not use if you have an allergy to CHG or antibacterial soaps. If your skin becomes reddened/irritated stop using the CHG.  Do not shave (including legs and underarms) for at least 48 hours prior to first CHG shower. It is OK to shave your face.  Please follow these instructions carefully.  Shower the NIGHT BEFORE SURGERY and the MORNING OF SURGERY with CHG Soap.   If you chose to wash your hair, wash your hair first as usual with your normal shampoo. After you shampoo, rinse your hair and body thoroughly to remove the shampoo.  Then [employer] and genitals (private parts) with your normal soap and rinse thoroughly to remove soap.  After that Use CHG Soap as you would any other liquid soap. You can apply CHG directly to the skin and wash gently with a scrungie or a clean  washcloth.   Apply the CHG Soap to your body ONLY FROM THE NECK DOWN.  Do not use on open wounds or open sores. Avoid contact with your eyes, ears, mouth and genitals (private parts). Wash Face and genitals (private parts)  with your normal soap.   Wash thoroughly, paying special attention to the area where your surgery will be performed.  Thoroughly rinse your body with warm water from the neck down.  DO NOT shower/wash with your normal soap after using and rinsing off the CHG Soap.  Pat yourself dry with a CLEAN TOWEL.  Wear CLEAN PAJAMAS to bed the night before surgery  Place CLEAN SHEETS on your bed the night before your surgery  DO NOT SLEEP WITH PETS.   Day of Surgery:  Take a shower with CHG soap. Wear Clean/Comfortable clothing the morning of surgery Do not apply any deodorants/lotions.   Remember to brush your teeth WITH YOUR REGULAR TOOTHPASTE.   Please read over the following fact sheets that you were given.

## 2021-03-17 ENCOUNTER — Other Ambulatory Visit: Payer: Self-pay

## 2021-03-17 ENCOUNTER — Encounter (HOSPITAL_COMMUNITY)
Admission: RE | Admit: 2021-03-17 | Discharge: 2021-03-17 | Disposition: A | Discharge status: Home / Self Care | Payer: Medicare Other | Source: Ambulatory Visit | Attending: Otolaryngology | Admitting: Otolaryngology

## 2021-03-17 ENCOUNTER — Encounter (HOSPITAL_COMMUNITY): Payer: Self-pay

## 2021-03-17 DIAGNOSIS — K219 Gastro-esophageal reflux disease without esophagitis: Secondary | ICD-10-CM | POA: Diagnosis not present

## 2021-03-17 DIAGNOSIS — Z79899 Other long term (current) drug therapy: Secondary | ICD-10-CM | POA: Diagnosis not present

## 2021-03-17 DIAGNOSIS — J45909 Unspecified asthma, uncomplicated: Secondary | ICD-10-CM | POA: Diagnosis not present

## 2021-03-17 DIAGNOSIS — C069 Malignant neoplasm of mouth, unspecified: Secondary | ICD-10-CM | POA: Diagnosis not present

## 2021-03-17 DIAGNOSIS — Z01818 Encounter for other preprocedural examination: Secondary | ICD-10-CM | POA: Insufficient documentation

## 2021-03-17 DIAGNOSIS — Z792 Long term (current) use of antibiotics: Secondary | ICD-10-CM | POA: Diagnosis not present

## 2021-03-17 DIAGNOSIS — I341 Nonrheumatic mitral (valve) prolapse: Secondary | ICD-10-CM | POA: Insufficient documentation

## 2021-03-17 DIAGNOSIS — D0000 Carcinoma in situ of oral cavity, unspecified site: Secondary | ICD-10-CM | POA: Diagnosis not present

## 2021-03-17 DIAGNOSIS — E782 Mixed hyperlipidemia: Secondary | ICD-10-CM | POA: Insufficient documentation

## 2021-03-17 DIAGNOSIS — Z87891 Personal history of nicotine dependence: Secondary | ICD-10-CM | POA: Insufficient documentation

## 2021-03-17 DIAGNOSIS — Z7951 Long term (current) use of inhaled steroids: Secondary | ICD-10-CM | POA: Diagnosis not present

## 2021-03-17 HISTORY — DX: Headache, unspecified: R51.9

## 2021-03-17 HISTORY — DX: Nonrheumatic mitral (valve) prolapse: I34.1

## 2021-03-17 HISTORY — DX: Anxiety disorder, unspecified: F41.9

## 2021-03-17 HISTORY — DX: Depression, unspecified: F32.A

## 2021-03-17 LAB — BASIC METABOLIC PANEL
Anion gap: 8 (ref 5–15)
BUN: 8 mg/dL (ref 8–23)
CO2: 29 mmol/L (ref 22–32)
Calcium: 9.9 mg/dL (ref 8.9–10.3)
Chloride: 93 mmol/L — ABNORMAL LOW (ref 98–111)
Creatinine, Ser: 0.74 mg/dL (ref 0.44–1.00)
GFR, Estimated: >60 mL/min (ref >60)
Glucose, Bld: 102 mg/dL — ABNORMAL HIGH (ref 70–99)
Potassium: 4.2 mmol/L (ref 3.5–5.1)
Sodium: 130 mmol/L — ABNORMAL LOW (ref 135–145)

## 2021-03-17 LAB — CBC
HCT: 38 % (ref 36.0–46.0)
Hemoglobin: 12.4 g/dL (ref 12.0–15.0)
MCH: 28.2 pg (ref 26.0–34.0)
MCHC: 32.6 g/dL (ref 30.0–36.0)
MCV: 86.4 fL (ref 80.0–100.0)
Platelets: 269 10*3/uL (ref 150–400)
RBC: 4.4 MIL/uL (ref 3.87–5.11)
RDW: 14 % (ref 11.5–15.5)
WBC: 6.3 10*3/uL (ref 4.0–10.5)
nRBC: 0 % (ref 0.0–0.2)

## 2021-03-17 NOTE — Progress Notes (Signed)
PCP - Dr. Monico Blitz Cardiologist - denies  PPM/ICD - n/a Device Orders - n/a Rep Notified - n/a  Chest x-ray - n/a EKG - 03/17/21 Stress Test - denies ECHO - Over 15 years ago at [facility] per patient.  Cardiac Cath - 05/18/12  Sleep Study - denies CPAP - n/a  Fasting Blood Sugar - n/a Checks Blood Sugar _ n/a _ times a day  Blood Thinner Instructions: n/a Aspirin Instructions: n/a  ERAS Protcol - No PRE-SURGERY Ensure or G2- n/a  COVID TEST- No. Ambulatory Surgery   Anesthesia review: Yes. Records requested from Dr. Manuella Ghazi. EKG Review  Patient denies shortness of breath, fever, cough and chest pain at PAT appointment   All instructions explained to the patient, with a verbal understanding of the material. Patient agrees to go over the instructions while at home for a better understanding. Patient also instructed to self quarantine after being tested for COVID-19. The opportunity to ask questions was provided.

## 2021-03-18 ENCOUNTER — Encounter (HOSPITAL_COMMUNITY): Payer: Self-pay

## 2021-03-18 NOTE — Progress Notes (Signed)
Anesthesia Chart Review:  Case: [id number] Date/Time: 03/19/21 0817   Procedure: Alveolectomy, mandibular, anterior, with frozen section   Anesthesia type: General   Pre-op diagnosis: Verrucous carcinoma of oral cavity; Erythroplakia of mouth; Tumor of oral cavity   Location: [facility] OR ROOM 06 / [place] OR   Surgeons: [name], MD       DISCUSSION: Patient is a 80 year old female scheduled for the above procedure. 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 Marland KitchenRecent CT showed a 2.2 cm malignant appearing lesion centered on the midline mandible alveolar ridge with extensive bony erosion and incisor loosening/loss. Cannot differentiate coaptation from invasion into the tongue. She is now scheduled for the above surgery.  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 Normal coronaries (with LAD spasm with injection) by 05/18/12 [facility].  She denied SOB, cough, fever, chest pain per PAT RN interview. Anesthesia team to evaluate on the day of surgery.   VS: BP (!) 151/88   Pulse 84   Temp 36.6 C (Oral)   Resp 18   Ht '5\' 8"'$  (1.727 m)   Wt 44.2 kg   SpO2 100%   BMI 14.81 kg/m    PROVIDERS: Monico Blitz, MD is PCP. PAT RN requested last records, but pending receipt.   LABS: Labs reviewed: Acceptable for surgery. (all labs ordered are listed, but only abnormal results are displayed)  Labs Reviewed  BASIC METABOLIC PANEL - Abnormal; Notable for the following components:      Result Value   Sodium 130 (*)    Chloride 93 (*)    Glucose, Bld 102 (*)    All other  components within normal limits  CBC     IMAGES: CT Soft tissue neck 03/16/21: IMPRESSION: 1. 2.2 cm malignant appearing lesion centered on the midline mandible alveolar ridge with extensive bony erosion and incisor loosening/loss. Cannot differentiate coaptation from invasion into the tongue. 2. Mildly enlarged bilateral submandibular nodes. 3. Post treatment right buccal space. No evidence of recurrent tumor in this area.    EKG: 03/17/21: Sinus rhythm with frequent Premature ventricular complexes Possible Left atrial enlargement Septal infarct , age undetermined Abnormal ECG Confirmed by Thompson Grayer ([id number]) on 03/17/2021 10:01:57 PM - PVCs are new, but otherwise EKG appears stable when compared to 11/20/18 tracing.   CV: Cardiac cath 05/18/12: Coronary Arteries: Right dominant with no anomalies LM: Normal LAD: Normal patient had spasm of the mid and distal vessel on initial injection Relieved with nitro. Large septal perforator that parallels the LAD             D1- Small and normal             D2- Normal Circumflex: Normal             OM1-  Large branching vessel Normal RCA: Normal             PDA- Normal             PLB- Normal   Ventriculography: EF: 70%, hyperdynamic   Hemodynamics:             Aortic Pressure: 112 64 mmHg  LV Pressure: 114 12  mmHg   Impression:  No significant CAD.  Spasm with injection relieved with nitro.   Myovue images normal just had ECG changes with Lexiscan     Nuclear stress test 05/17/12 [facility], scanned under Media tab) Although there was no evidence of ischemia on perfusion analysis, the presence of ischemic changes with Lexiscan and positive transient ischemic dilation (TID of 1.29) raise the possibility of balanced ischemia.  Correlate clinically. [She was transferred to [facility] for [place] as outlined above.]   She thought she had an echo > 15 years ago at [facility], now  [facility].   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   Tobacco abuse    smoked x 35 years, quit 12 years ago   Wears glasses     Past Surgical History:  Procedure Laterality Date   ABDOMINAL HYSTERECTOMY  09/19/2018   CARDIAC CATHETERIZATION     10/13   DILATION AND CURETTAGE OF UTERUS     EXCISION OF ORAL TUMOR  4/12   rt bucca-squamus cell ca   EXCISION OF TONGUE LESION WITH LASER N/A 01/09/2013   Procedure: ORAL BIOPSY AND LASER ABLATION;  Surgeon: [name], MD;  Location: [place];  Service: ENT;  Laterality: N/A;   EXCISION ORAL LESION WITH CO2 LASER Right 11/09/2015   Procedure: EXCISION ORAL LESION WITH CO2 LASER AND FROZEN SECTION ;  Surgeon: [name], MD;  Location: [place];  Service: ENT;  Laterality: Right;   EXCISION ORAL LESION WITH CO2 LASER N/A 08/13/2018   Procedure: EXCISION/biopsy ORAL LESION WITH CO2 LASER ablation;  Surgeon: [name], MD;  Location: [place];  Service: ENT;  Laterality: N/A;   EXCISION ORAL LESION WITH CO2 LASER N/A 11/20/2018   Procedure: EXCISION ORAL LESION WITH CO2 LASER;  Surgeon: [name], MD;  Location: [place];  Service: ENT;  Laterality: N/A;   EXCISION ORAL TUMOR N/A 05/08/2019   Procedure: EXCISION ORAL CANCER WITH DEBRIDEMENT OF MAXILLARY BONE;  Surgeon: [name], MD;  Location: [place];  Service: ENT;  Laterality: N/A;   EYE SURGERY     lazer eye surgeries   LEFT HEART CATHETERIZATION WITH CORONARY ANGIOGRAM N/A 05/18/2012   Procedure: LEFT HEART CATHETERIZATION WITH CORONARY ANGIOGRAM;  Surgeon: Josue Hector, MD;  Location: [facility] CATH LAB;  Service: Cardiovascular;  Laterality: N/A;   SKIN FULL THICKNESS GRAFT Right 11/23/2018   Procedure: SKIN GRAFT FULL  THICKNESS;  Surgeon: [name], MD;  Location: [place];  Service: ENT;  Laterality: Right;    MEDICATIONS:  acetaminophen (TYLENOL) 500 MG tablet   albuterol (PROVENTIL HFA;VENTOLIN HFA) 108 (90 BASE) MCG/ACT inhaler   Cholecalciferol (VITAMIN D) 2000 UNITS tablet   clonazePAM (KLONOPIN) 0.5 MG tablet   diphenhydrAMINE (BENADRYL) 25 MG tablet   Fluticasone-Salmeterol (ADVAIR) 250-50 MCG/DOSE AEPB   multivitamin-lutein (OCUVITE-LUTEIN) CAPS   pantoprazole (PROTONIX) 40 MG tablet   Polyethyl Glycol-Propyl Glycol (SYSTANE OP)   pravastatin (PRAVACHOL) 20 MG tablet   amoxicillin (AMOXIL) 500 MG capsule   fluocinonide ointment (LIDEX) 0.05 %   No current facility-administered medications for this encounter.    Myra Gianotti, PA-C Surgical Short Stay/Anesthesiology [facility]  Phone [phone] [facility] Phone [phone] 03/18/2021 10:03 AM

## 2021-03-18 NOTE — Anesthesia Preprocedure Evaluation (Addendum)
Anesthesia Evaluation  Patient identified by MRN, date of birth, ID band Patient awake    Reviewed: Allergy & Precautions, H&P , NPO status , Patient's Chart, lab work & pertinent test results  Airway Mallampati: II  TM Distance: >3 FB Neck ROM: Full    Dental no notable dental hx. (+) Poor Dentition, Dental Advisory Given   Pulmonary asthma , former smoker,    Pulmonary exam normal breath sounds clear to auscultation       Cardiovascular Exercise Tolerance: Good + Valvular Problems/Murmurs MVP  Rhythm:Regular Rate:Normal     Neuro/Psych  Headaches, Anxiety Depression    GI/Hepatic Neg liver ROS, GERD  Medicated,  Endo/Other  negative endocrine ROS  Renal/GU negative Renal ROS  negative genitourinary   Musculoskeletal   Abdominal   Peds  Hematology negative hematology ROS (+)   Anesthesia Other Findings   Reproductive/Obstetrics negative OB ROS                           Anesthesia Physical Anesthesia Plan  ASA: 3  Anesthesia Plan: General   Post-op Pain Management:    Induction: Intravenous  PONV Risk Score and Plan: 4 or greater and Dexamethasone and Treatment may vary due to age or medical condition  Airway Management Planned: Nasal ETT and Video Laryngoscope Planned  Additional Equipment:   Intra-op Plan:   Post-operative Plan: Extubation in OR  Informed Consent: I have reviewed the patients History and Physical, chart, labs and discussed the procedure including the risks, benefits and alternatives for the proposed anesthesia with the patient or authorized representative who has indicated his/her understanding and acceptance.     Dental advisory given  Plan Discussed with: CRNA  Anesthesia Plan Comments: (PAT note written 03/18/2021 by Myra Gianotti, PA-C. )      Anesthesia Quick Evaluation

## 2021-03-19 ENCOUNTER — Other Ambulatory Visit: Payer: Self-pay

## 2021-03-19 ENCOUNTER — Encounter (HOSPITAL_COMMUNITY)
Admission: RE | Disposition: A | Discharge status: Home / Self Care | Payer: Self-pay | Source: Home / Self Care | Attending: Otolaryngology

## 2021-03-19 ENCOUNTER — Ambulatory Visit (HOSPITAL_COMMUNITY): Payer: Medicare Other | Admitting: Vascular Surgery

## 2021-03-19 ENCOUNTER — Encounter (HOSPITAL_COMMUNITY): Payer: Self-pay | Admitting: Otolaryngology

## 2021-03-19 ENCOUNTER — Observation Stay (HOSPITAL_COMMUNITY)
Admission: RE | Admit: 2021-03-19 | Discharge: 2021-03-20 | Disposition: A | Discharge status: Home / Self Care | Payer: Medicare Other | Attending: Otolaryngology | Admitting: Otolaryngology

## 2021-03-19 ENCOUNTER — Ambulatory Visit (HOSPITAL_COMMUNITY): Payer: Medicare Other | Admitting: Anesthesiology

## 2021-03-19 DIAGNOSIS — C148 Malignant neoplasm of overlapping sites of lip, oral cavity and pharynx: Secondary | ICD-10-CM | POA: Diagnosis not present

## 2021-03-19 DIAGNOSIS — J45909 Unspecified asthma, uncomplicated: Secondary | ICD-10-CM | POA: Diagnosis not present

## 2021-03-19 DIAGNOSIS — K056 Periodontal disease, unspecified: Secondary | ICD-10-CM | POA: Diagnosis not present

## 2021-03-19 DIAGNOSIS — K219 Gastro-esophageal reflux disease without esophagitis: Secondary | ICD-10-CM | POA: Diagnosis not present

## 2021-03-19 DIAGNOSIS — K1329 Other disturbances of oral epithelium, including tongue: Secondary | ICD-10-CM | POA: Insufficient documentation

## 2021-03-19 DIAGNOSIS — E785 Hyperlipidemia, unspecified: Secondary | ICD-10-CM | POA: Diagnosis not present

## 2021-03-19 DIAGNOSIS — Z87891 Personal history of nicotine dependence: Secondary | ICD-10-CM | POA: Diagnosis not present

## 2021-03-19 DIAGNOSIS — K055 Other periodontal diseases: Secondary | ICD-10-CM | POA: Insufficient documentation

## 2021-03-19 DIAGNOSIS — C069 Malignant neoplasm of mouth, unspecified: Secondary | ICD-10-CM | POA: Diagnosis not present

## 2021-03-19 DIAGNOSIS — K029 Dental caries, unspecified: Secondary | ICD-10-CM | POA: Insufficient documentation

## 2021-03-19 DIAGNOSIS — C411 Malignant neoplasm of mandible: Secondary | ICD-10-CM | POA: Diagnosis not present

## 2021-03-19 DIAGNOSIS — Z85818 Personal history of malignant neoplasm of other sites of lip, oral cavity, and pharynx: Secondary | ICD-10-CM | POA: Insufficient documentation

## 2021-03-19 HISTORY — PX: ALVEOLOPLASTY: SHX5710

## 2021-03-19 SURGERY — ALVEOLOPLASTY
Anesthesia: General

## 2021-03-19 MED ORDER — LIDOCAINE-EPINEPHRINE 2 %-1:100000 IJ SOLN
INTRAMUSCULAR | Status: AC
  Filled 2021-03-19: qty 5.1

## 2021-03-19 MED ORDER — ROCURONIUM BROMIDE 10 MG/ML (PF) SYRINGE
PREFILLED_SYRINGE | INTRAVENOUS | Status: AC
  Filled 2021-03-19: qty 10

## 2021-03-19 MED ORDER — PRAVASTATIN SODIUM 10 MG PO TABS
20.0000 mg | ORAL_TABLET | Freq: Every day | ORAL | Status: DC
  Administered 2021-03-19: 20 mg via ORAL
  Filled 2021-03-19: qty 2

## 2021-03-19 MED ORDER — HYDROCODONE-ACETAMINOPHEN 7.5-325 MG PO TABS: 1.0000 | ORAL_TABLET | Freq: Four times a day (QID) | ORAL | 0 refills | Status: DC | PRN

## 2021-03-19 MED ORDER — EPHEDRINE 5 MG/ML INJ
INTRAVENOUS | Status: AC
  Filled 2021-03-19: qty 5

## 2021-03-19 MED ORDER — FENTANYL CITRATE (PF) 100 MCG/2ML IJ SOLN
25.0000 ug | INTRAMUSCULAR | Status: DC | PRN
  Administered 2021-03-19 (×4): 25 ug via INTRAVENOUS

## 2021-03-19 MED ORDER — ONDANSETRON HCL 4 MG/2ML IJ SOLN
INTRAMUSCULAR | Status: DC | PRN
  Administered 2021-03-19: 4 mg via INTRAVENOUS

## 2021-03-19 MED ORDER — ACETAMINOPHEN 500 MG PO TABS: 500.0000 mg | ORAL_TABLET | Freq: Four times a day (QID) | ORAL | Status: DC | PRN

## 2021-03-19 MED ORDER — FENTANYL CITRATE (PF) 100 MCG/2ML IJ SOLN
INTRAMUSCULAR | Status: AC
  Filled 2021-03-19: qty 2

## 2021-03-19 MED ORDER — SUGAMMADEX SODIUM 200 MG/2ML IV SOLN
INTRAVENOUS | Status: DC | PRN
  Administered 2021-03-19: 100 mg via INTRAVENOUS

## 2021-03-19 MED ORDER — FENTANYL CITRATE (PF) 250 MCG/5ML IJ SOLN
INTRAMUSCULAR | Status: DC | PRN
  Administered 2021-03-19: 50 ug via INTRAVENOUS

## 2021-03-19 MED ORDER — ONDANSETRON HCL 4 MG/2ML IJ SOLN: 4.0000 mg | INTRAMUSCULAR | Status: DC | PRN

## 2021-03-19 MED ORDER — CLINDAMYCIN PHOSPHATE 600 MG/50ML IV SOLN
600.0000 mg | INTRAVENOUS | Status: AC
  Administered 2021-03-19: 600 mg via INTRAVENOUS
  Filled 2021-03-19: qty 50

## 2021-03-19 MED ORDER — CLINDAMYCIN HCL 150 MG PO CAPS: 150.0000 mg | ORAL_CAPSULE | Freq: Four times a day (QID) | ORAL | 0 refills | Status: AC

## 2021-03-19 MED ORDER — FENTANYL CITRATE (PF) 250 MCG/5ML IJ SOLN
INTRAMUSCULAR | Status: AC
  Filled 2021-03-19: qty 5

## 2021-03-19 MED ORDER — LIDOCAINE 2% (20 MG/ML) 5 ML SYRINGE
INTRAMUSCULAR | Status: DC | PRN
  Administered 2021-03-19: 60 mg via INTRAVENOUS

## 2021-03-19 MED ORDER — PROSIGHT PO TABS
1.0000 | ORAL_TABLET | Freq: Every evening | ORAL | Status: DC
  Administered 2021-03-19: 1 via ORAL
  Filled 2021-03-19 (×2): qty 1

## 2021-03-19 MED ORDER — HYDROCODONE-ACETAMINOPHEN 7.5-325 MG/15ML PO SOLN
10.0000 mL | ORAL | Status: DC | PRN
  Administered 2021-03-19 – 2021-03-20 (×3): 15 mL via ORAL
  Filled 2021-03-19 (×3): qty 15

## 2021-03-19 MED ORDER — DIPHENHYDRAMINE HCL 25 MG PO CAPS
25.0000 mg | ORAL_CAPSULE | Freq: Every day | ORAL | Status: DC
  Administered 2021-03-20: 25 mg via ORAL
  Filled 2021-03-19: qty 1

## 2021-03-19 MED ORDER — OXYMETAZOLINE HCL 0.05 % NA SOLN
2.0000 | NASAL | Status: DC
  Administered 2021-03-19: 2 via NASAL
  Filled 2021-03-19: qty 30

## 2021-03-19 MED ORDER — LACTATED RINGERS IV SOLN
INTRAVENOUS | Status: DC
Start: 2021-03-19 — End: 2021-03-19

## 2021-03-19 MED ORDER — ONDANSETRON HCL 4 MG/2ML IJ SOLN
INTRAMUSCULAR | Status: AC
  Filled 2021-03-19: qty 2

## 2021-03-19 MED ORDER — ALBUTEROL SULFATE (2.5 MG/3ML) 0.083% IN NEBU: 3.0000 mL | INHALATION_SOLUTION | RESPIRATORY_TRACT | Status: DC | PRN

## 2021-03-19 MED ORDER — PROPOFOL 10 MG/ML IV BOLUS
INTRAVENOUS | Status: AC
  Filled 2021-03-19: qty 20

## 2021-03-19 MED ORDER — ACETAMINOPHEN 500 MG PO TABS
1000.0000 mg | ORAL_TABLET | Freq: Once | ORAL | Status: AC
  Administered 2021-03-19: 1000 mg via ORAL
  Filled 2021-03-19: qty 2

## 2021-03-19 MED ORDER — CHLORHEXIDINE GLUCONATE 0.12 % MT SOLN
15.0000 mL | Freq: Once | OROMUCOSAL | Status: AC
  Administered 2021-03-19: 15 mL via OROMUCOSAL
  Filled 2021-03-19: qty 15

## 2021-03-19 MED ORDER — ONDANSETRON HCL 4 MG PO TABS: 4.0000 mg | ORAL_TABLET | ORAL | Status: DC | PRN

## 2021-03-19 MED ORDER — DEXTROSE-NACL 5-0.9 % IV SOLN: INTRAVENOUS | Status: DC

## 2021-03-19 MED ORDER — ONDANSETRON HCL 8 MG PO TABS: 8.0000 mg | ORAL_TABLET | Freq: Three times a day (TID) | ORAL | 0 refills | Status: DC | PRN

## 2021-03-19 MED ORDER — PANTOPRAZOLE SODIUM 40 MG PO TBEC
40.0000 mg | DELAYED_RELEASE_TABLET | Freq: Every day | ORAL | Status: DC
  Administered 2021-03-20: 40 mg via ORAL
  Filled 2021-03-19: qty 1

## 2021-03-19 MED ORDER — CLINDAMYCIN PALMITATE HCL 75 MG/5ML PO SOLR
300.0000 mg | Freq: Three times a day (TID) | ORAL | Status: AC
  Administered 2021-03-19 – 2021-03-20 (×2): 300 mg via ORAL
  Filled 2021-03-19 (×4): qty 20

## 2021-03-19 MED ORDER — CLONAZEPAM 0.5 MG PO TABS
0.5000 mg | ORAL_TABLET | Freq: Two times a day (BID) | ORAL | Status: DC
  Administered 2021-03-19 – 2021-03-20 (×2): 0.5 mg via ORAL
  Filled 2021-03-19 (×2): qty 1

## 2021-03-19 MED ORDER — ROCURONIUM BROMIDE 10 MG/ML (PF) SYRINGE
PREFILLED_SYRINGE | INTRAVENOUS | Status: DC | PRN
  Administered 2021-03-19: 50 mg via INTRAVENOUS

## 2021-03-19 MED ORDER — ORAL CARE MOUTH RINSE: 15.0000 mL | Freq: Once | OROMUCOSAL | Status: AC

## 2021-03-19 MED ORDER — VITAMIN D 25 MCG (1000 UNIT) PO TABS
2000.0000 [IU] | ORAL_TABLET | Freq: Every day | ORAL | Status: DC
  Administered 2021-03-19: 2000 [IU] via ORAL
  Filled 2021-03-19: qty 2

## 2021-03-19 MED ORDER — MOMETASONE FURO-FORMOTEROL FUM 200-5 MCG/ACT IN AERO
2.0000 | INHALATION_SPRAY | Freq: Two times a day (BID) | RESPIRATORY_TRACT | Status: DC
  Administered 2021-03-19 – 2021-03-20 (×2): 2 via RESPIRATORY_TRACT
  Filled 2021-03-19: qty 8.8

## 2021-03-19 MED ORDER — PROPOFOL 10 MG/ML IV BOLUS
INTRAVENOUS | Status: DC | PRN
  Administered 2021-03-19: 50 mg via INTRAVENOUS

## 2021-03-19 MED ORDER — POLYVINYL ALCOHOL 1.4 % OP SOLN: 1.0000 [drp] | Freq: Every day | OPHTHALMIC | Status: DC | PRN

## 2021-03-19 MED ORDER — LIDOCAINE 2% (20 MG/ML) 5 ML SYRINGE
INTRAMUSCULAR | Status: AC
  Filled 2021-03-19: qty 5

## 2021-03-19 SURGICAL SUPPLY — 43 items
ALCOHOL 70% 16 OZ (MISCELLANEOUS) ×2 IMPLANT
BAG COUNTER SPONGE SURGICOUNT (BAG) ×2 IMPLANT
BAG SPNG CNTER NS LX DISP (BAG) ×1
BLADE SURG 15 STRL LF DISP TIS (BLADE) ×1 IMPLANT
BLADE SURG 15 STRL SS (BLADE) ×2
BUR 7 SOFT (BURR) ×1 IMPLANT
BUR EGG ELITE 4.0 (BURR) ×1 IMPLANT
BUR SPIRAL ROUTER 2.3 (BUR) IMPLANT
COVER SURGICAL LIGHT HANDLE (MISCELLANEOUS) ×2 IMPLANT
DRSG TELFA 3X8 NADH (GAUZE/BANDAGES/DRESSINGS) ×2 IMPLANT
GAUZE 4X4 16PLY ~~LOC~~+RFID DBL (SPONGE) ×2 IMPLANT
GAUZE PACKING FOLDED 2  STR (GAUZE/BANDAGES/DRESSINGS) ×2
GAUZE PACKING FOLDED 2 STR (GAUZE/BANDAGES/DRESSINGS) ×1 IMPLANT
GLOVE SURG ENC MOIS LTX SZ6.5 (GLOVE) ×2 IMPLANT
GLOVE SURG POLYISO LF SZ6 (GLOVE) ×2 IMPLANT
GOWN STRL REUS W/ TWL LRG LVL3 (GOWN DISPOSABLE) ×2 IMPLANT
GOWN STRL REUS W/TWL LRG LVL3 (GOWN DISPOSABLE) ×4
KIT BASIN OR (CUSTOM PROCEDURE TRAY) ×2 IMPLANT
KIT TURNOVER KIT B (KITS) ×2 IMPLANT
MANIFOLD NEPTUNE II (INSTRUMENTS) ×2 IMPLANT
NDL DENTAL 27 LONG (NEEDLE) ×2 IMPLANT
NEEDLE BLUNT 16X1.5 OR ONLY (NEEDLE) ×2 IMPLANT
NEEDLE DENTAL 27 LONG (NEEDLE) IMPLANT
NS IRRIG 1000ML POUR BTL (IV SOLUTION) ×2 IMPLANT
PACK EENT II TURBAN DRAPE (CUSTOM PROCEDURE TRAY) ×2 IMPLANT
PAD ARMBOARD 7.5X6 YLW CONV (MISCELLANEOUS) ×2 IMPLANT
PENCIL FOOT CONTROL (ELECTRODE) ×1 IMPLANT
SPONGE SURGIFOAM ABS GEL 100 (HEMOSTASIS) IMPLANT
SPONGE SURGIFOAM ABS GEL 12-7 (HEMOSTASIS) IMPLANT
SPONGE SURGIFOAM ABS GEL SZ50 (HEMOSTASIS) IMPLANT
SUCTION FRAZIER HANDLE 10FR (MISCELLANEOUS) ×2
SUCTION TUBE FRAZIER 10FR DISP (MISCELLANEOUS) ×1 IMPLANT
SUT BONE WAX W31G (SUTURE) ×1 IMPLANT
SUT CHROMIC 3 0 PS 2 (SUTURE) IMPLANT
SUT CHROMIC 4 0 P 3 18 (SUTURE) IMPLANT
SUT VIC AB 3-0 FS2 27 (SUTURE) ×3 IMPLANT
SYR 50ML SLIP (SYRINGE) ×2 IMPLANT
SYR BULB IRRIG 60ML STRL (SYRINGE) ×2 IMPLANT
TOWEL GREEN STERILE FF (TOWEL DISPOSABLE) ×2 IMPLANT
TUBE CONNECTING 12X1/4 (SUCTIONS) ×2 IMPLANT
WATER STERILE IRR 1000ML POUR (IV SOLUTION) ×2 IMPLANT
WATER TABLETS ICX (MISCELLANEOUS) ×2 IMPLANT
YANKAUER SUCT BULB TIP NO VENT (SUCTIONS) ×2 IMPLANT

## 2021-03-19 NOTE — Interval H&P Note (Signed)
History and Physical Interval Note:  03/19/2021 8:28 AM  Meredith Stewart  has presented today for surgery, with the diagnosis of Verrucous carcinoma of oral cavity; Erythroplakia of mouth; Tumor of oral cavity.  The various methods of treatment have been discussed with the patient and family. After consideration of risks, benefits and other options for treatment, the patient has consented to  Procedure(s): Alveolectomy, mandibular, anterior, with frozen section (N/A) as a surgical intervention.  The patient's history has been reviewed, patient examined, no change in status, stable for surgery.  I have reviewed the patient's chart and labs.  Questions were answered to the patient's satisfaction.     Charlaine Dalton

## 2021-03-19 NOTE — Op Note (Signed)
OPERATIVE REPORT  DATE OF SURGERY: 03/19/2021  PATIENT:  Meredith Stewart,  80 y.o. female  PRE-OPERATIVE DIAGNOSIS:  Verrucous carcinoma of oral cavity; Erythroplakia of mouth; Tumor of oral cavity  POST-OPERATIVE DIAGNOSIS:  Verrucous carcinoma of oral cavity; Erythroplakia of mouth; Tumor of oral cavity  PROCEDURE:  Procedure(s): Alveolectomy, mandibular, anterior, with frozen section  SURGEON:  Beckie Salts, MD  ASSISTANTS: None  ANESTHESIA:   General   EBL: 150 ml  DRAINS: None  LOCAL MEDICATIONS USED: 2% Xylocaine with epinephrine  SPECIMEN: Anterior mandibular alveolectomy, suture marks right side.  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  COUNTS:  Correct  PROCEDURE DETAILS: The patient was taken to the operating room and placed on the operating table in the supine position. Following induction of general endotracheal anesthesia, using a nasal intubation, the face was draped in a standard fashion.  Initially the multiple tooth extraction was performed by Dr. Benson Norway.  Following that the oral cavity was inspected and mucosal margins were outlined around the anterior mandibular alveolar lesion using electrocautery.  Electrocautery was then used to incise mucosa down to the mandibular bone.  This is done anteriorly and then remove the floor of mouth. Submandibular ducts were not identified.  The bone cuts were then performed using a high-speed Stryker drill with a sidecutting bur.  The specimen was removed and sent for pathologic evaluation with a marking suture.  Silk ties and electrocautery were used for hemostasis.  Bone wax was used on the mandible.  The mandibular rim was kept intact.  Another long cutting bur was then used to smooth out the anterior edge of the mandible.  Following that the defect was closed in layers using interrupted 3-0 Vicryl sutures on the deep muscular layer and on the mucosa.  No further procedures were performed.   Patient was awakened extubated and transferred to recovery in stable condition.    PATIENT DISPOSITION:  To PACU, stable

## 2021-03-19 NOTE — Transfer of Care (Signed)
Immediate Anesthesia Transfer of Care Note  Patient: Meredith Stewart  Procedure(s) Performed: Alveolectomy, mandibular, anterior, with frozen section  Patient Location: PACU  Anesthesia Type:General  Level of Consciousness: awake, alert , oriented and drowsy  Airway & Oxygen Therapy: Patient Spontanous Breathing and Patient connected to face mask oxygen  Post-op Assessment: Report given to RN and Post -op Vital signs reviewed and stable  Post vital signs: Reviewed and stable  Last Vitals:  Vitals Value Taken Time  BP 129/77 03/19/21 1106  Temp    Pulse 73 03/19/21 1109  Resp 15 03/19/21 1109  SpO2 100 % 03/19/21 1109  Vitals shown include unvalidated device data.  Last Pain:  Vitals:   03/19/21 0717  TempSrc:   PainSc: 0-No pain         Complications: No notable events documented.

## 2021-03-19 NOTE — Anesthesia Procedure Notes (Signed)
Procedure Name: Intubation Date/Time: 03/19/2021 9:17 AM Performed by: Trinna Post., CRNA Pre-anesthesia Checklist: Patient identified, Emergency Drugs available, Suction available, Timeout performed and Patient being monitored Patient Re-evaluated:Patient Re-evaluated prior to induction Oxygen Delivery Method: Circle system utilized Preoxygenation: Pre-oxygenation with 100% oxygen Induction Type: IV induction Ventilation: Mask ventilation without difficulty Laryngoscope Size: Glidescope and 3 Grade View: Grade I Nasal Tubes: Right, Nasal prep performed and Nasal Rae Tube size: 7.0 mm Number of attempts: 1 Airway Equipment and Method: Video-laryngoscopy Placement Confirmation: ETT inserted through vocal cords under direct vision, positive ETCO2 and breath sounds checked- equal and bilateral Tube secured with: Tape Dental Injury: Teeth and Oropharynx as per pre-operative assessment

## 2021-03-19 NOTE — Op Note (Signed)
Department of Dental Medicine    OPERATIVE REPORT  DATE OF SURGERY:   03/19/2021  PATIENT'S NAME:   Meredith Stewart DATE OF BIRTH:   [birth year] MEDICAL RECORD NUMBER: [id number]  SURGEON:   Wally Shevchenko B. Benson Norway, D.M.D.  ASSISTANT:  Molli Posey, DAII  PREOPERATIVE DIAGNOSES:  Dental caries, periodontal disease  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   Hyperlipidemia 05/18/2012   Chest pain, mid sternal 05/18/2012   Abnormal LFTs 05/18/2012   GERD (gastroesophageal reflux disease)    Tobacco abuse     POSTOPERATIVE DIAGNOSES:  Dental caries, periodontal disease  PROCEDURES PERFORMED: Extractions of teeth numbers 22, 23, 26, 27 and 28  ANESTHESIA:  General anesthesia via nasal endotracheal tube.  MEDICATIONS: Ancef 2 g IV prior to invasive dental procedures. Local anesthesia with a total utilization of 1 cartridges of 34 mg of lidocaine with 0.018 mg of epinephrine/ea.  SPECIMENS:  5 teeth that were extracted and discarded  DRAINS/CULTURES:  None  COMPLICATIONS:  None  ESTIMATED BLOOD LOSS:  5 mL  INTRAVENOUS FLUIDS:  10 mL of Lactated ringers solution  INDICATIONS:  The patient was recently diagnosed with oral tumor and she has h/o recurrent lichen planus.  A medically necessary dental consult was then requested to evaluate the patient for any dental/orofacial infection and their overall oral health and to work with otolaryngology to extract teeth at time of hard tissue biopsy.  The patient was examined and subsequently treatment planned for extractions of teeth numbers 22, 23, 26, 27 and 28.  Of note: tooth #24 which was originally planned for extraction as well was missing at the time of the dental medicine procedure.  It had already fallen out due to severe mobility and per anesthesia team was missing prior to bringing the patient back to the operating room. This treatment plan was made to decrease the perioperative and  postoperative risks and complications associated with dental/orofacial infection from affecting the patient's systemic health.  OPERATIVE FINDINGS:  The patient was examined in operating room number 6.  The indicated teeth were identified and verified for extraction. The patient was noted be affected by severe dental decay and periodontal disease.  DESCRIPTION OF PROCEDURE:  The patient was identified in the holding area and brought to the main operating room number 6  by the anesthesia team. The patient was then placed in the supine position on the operating table.  General anesthesia was then induced per the anesthesia team. The patient was then prepped and draped in the usual sterile fashion for dental medicine procedures.  A timeout was performed. The patient was identified and procedures were verified. A throat pack was placed at this time. The oral cavity was then thoroughly examined with the findings noted above. The patient was then ready for the dental medicine procedure as follows:   ANESTHESIA: Local anesthesia was administered sequentially with a total utilization of 1 cartridges each containing 34 mg of lidocaine with 0.018 mg of epinephrine. Location of anesthesia included lower anterior infiltration.  ROUTINE EXTRACTIONS: The mandibular left and right quadrants were then approached. The teeth were subluxated with a series of straight elevators.  Teeth numbers 22, 23, 26, 27 and 28  were then removed utilizing a 151 forceps without complications. The tissues were approximated and trimmed appropriately to help achieve primary closure. The surgical sites were then irrigated with copious amounts of sterile saline.   END OF PROCEDURE: Thorough oral irrigation with sterile  saline was performed.  Good hemostasis was observed.  The patient was examined for complications, and seeing none, the dental medicine procedure was deemed to be complete.  The throat pack was removed at this time. A series  of 4x4 gauze were placed in the mouth to aid hemostasis as needed.  The patient was then handed over to the otolaryngology team for their portion of the procedure. All counts were correct for the dental medicine procedure.    [place] Benson Norway, D.M.D.

## 2021-03-19 NOTE — Anesthesia Postprocedure Evaluation (Signed)
Anesthesia Post Note  Patient: Meredith Stewart  Procedure(s) Performed: Alveolectomy, mandibular, anterior, with frozen section     Patient location during evaluation: PACU Anesthesia Type: General Level of consciousness: awake and alert Pain management: pain level controlled Vital Signs Assessment: post-procedure vital signs reviewed and stable Respiratory status: spontaneous breathing, nonlabored ventilation and respiratory function stable Cardiovascular status: blood pressure returned to baseline and stable Postop Assessment: no apparent nausea or vomiting Anesthetic complications: no   No notable events documented.  Last Vitals:  Vitals:   03/19/21 1120 03/19/21 1135  BP: (!) 149/82 (!) 152/86  Pulse: 70 69  Resp: 19 16  Temp:    SpO2: 98% 94%    Last Pain:  Vitals:   03/19/21 1135  TempSrc:   PainSc: Asleep                 Elston Aldape,W. EDMOND

## 2021-03-19 NOTE — Interval H&P Note (Signed)
History and Physical Interval Note:  03/19/2021 8:50 AM  Meredith Stewart  has presented today for surgery, with the diagnosis of Verrucous carcinoma of oral cavity; Erythroplakia of mouth; Tumor of oral cavity.  The various methods of treatment have been discussed with the patient and family. After consideration of risks, benefits and other options for treatment, the patient has consented to  Procedure(s): Alveolectomy, mandibular, anterior, with frozen section (N/A) as a surgical intervention.  The patient's history has been reviewed, patient examined, no change in status, stable for surgery.  I have reviewed the patient's chart and labs.  Questions were answered to the patient's satisfaction.     [name]

## 2021-03-19 NOTE — Discharge Instructions (Signed)
[facility] Department of Dental Medicine Reshad Saab B. Demetria Iwai, D.M.D. Phone: [phone] Fax: [phone]    MOUTH CARE AFTER SURGERY   FACTS: Ice used in ice bag helps keep the swelling down, and can help lessen the pain. It is easier to treat pain BEFORE it happens. Spitting disturbs the clot and may cause bleeding to start again, or to get worse. Smoking delays healing and can cause complications. Sharing prescriptions can be dangerous.  Do not take medications not recently prescribed for you. Antibiotics may stop birth control pills from working.  Use other means of birth control while on antibiotics. Warm salt water rinses after the first 24 hours will help lessen the swelling:  Use 1/2 teaspoonful of table salt per oz.of water.  DO NOT: Do not spit.   Do not drink through a straw. Strongly advised not to smoke, dip snuff or chew tobacco at least for 3 days. Do not eat sharp or crunchy foods.  Avoid the area of surgery when chewing. Do not stop your antibiotics before your instructions say to do so. Do not eat hot foods until bleeding has stopped.  If you need to, let your food cool down to room temperature.  EXPECT: Some swelling, especially first 2-3 days. Soreness or discomfort in varying degrees.  Follow your dentist's instructions about how to handle pain before it starts. Pinkish saliva or light blood in saliva, or on your pillow in the morning.  This can last around 24 hours. Bruising inside or outside the mouth.  This may not show up until 2-3 days after surgery.  Don't worry, it will go away in time. Pieces of "bone" may work themselves loose.  It's OK.  If they bother you, let us know.    WHAT TO DO IMMEDIATELY AFTER SURGERY: Bite on gauze with steady pressure for 30-45 minutes at a time.  Switch out the gauze after 30-45 minutes for clean gauze, and continue this for 1-2 hours or until bleeding subsides. Do not chew on the gauze. Do not lie down flat.  Raise  your head support especially for the first 24 hours. Apply ice to your face on the side of the surgery.  You may apply it 20 minutes on and a few minutes off.  Ice for 8-12 hours.  You may use ice up to 24 hours. Before the numbness wears off, take a pain pill as instructed. Prescription pain medication is not always required.  SWELLING: Expect swelling for the first couple of days.  It should get better after that. If swelling increases 3 days or so after surgery, let us know as soon as possible.  FEVER: Take Tylenol every 4 hours if needed to lower your temperature, especially if it is at 100F or higher. Drink lots of fluids. If the fever does not go away, let us know.  BREATHING TROUBLE: Any unusual difficulty breathing means you have to have someone bring you to the emergency room ASAP.  BLEEDING: Light oozing is expected for 24 hours or so. Prop head up with pillows. Do not spit. Do not confuse bright red fresh flowing blood with lots of saliva colored with a little bit of blood. If you notice some bleeding, place gauze or a tea bag where it is bleeding and apply CONSTANT pressure by biting down for 1 hour.  Avoid talking during this time.  Do not remove the gauze or tea bag during this hour to "check" the bleeding. If you notice bright RED bleeding FLOWING out   of particular area, and filling the floor of your mouth, put a wad of gauze on that area, bite down firmly and constantly.  Call us immediately.  If we're closed, have someone bring you to the emergency room.  ORAL HYGIENE: Brush your teeth as usual after meals and before bedtime. Use a soft toothbrush around the area of surgery. DO NOT AVOID BRUSHING.  Otherwise bacteria(germs) will grow and may delay healing or encourage infection. Since you cannot spit, just gently rinse and let the water flow out of your mouth. DO NOT SWISH HARD.  EATING: Cool liquids are a good point to start.  Increase to soft foods as  tolerated.   PRESCRIPTIONS: Follow the directions for your prescriptions exactly as written. If your doctor gave you a narcotic pain medication, do not drive, operate machinery or drink alcohol when on that medication.   QUESTIONS? Call our office during office hours [phone] or call the Emergency Room at [phone].  

## 2021-03-20 ENCOUNTER — Encounter (HOSPITAL_COMMUNITY): Payer: Self-pay | Admitting: Otolaryngology

## 2021-03-20 DIAGNOSIS — K055 Other periodontal diseases: Secondary | ICD-10-CM | POA: Diagnosis not present

## 2021-03-20 DIAGNOSIS — K029 Dental caries, unspecified: Secondary | ICD-10-CM | POA: Diagnosis not present

## 2021-03-20 DIAGNOSIS — J45909 Unspecified asthma, uncomplicated: Secondary | ICD-10-CM | POA: Diagnosis not present

## 2021-03-20 DIAGNOSIS — C148 Malignant neoplasm of overlapping sites of lip, oral cavity and pharynx: Secondary | ICD-10-CM | POA: Diagnosis not present

## 2021-03-20 DIAGNOSIS — K1329 Other disturbances of oral epithelium, including tongue: Secondary | ICD-10-CM | POA: Diagnosis not present

## 2021-03-20 DIAGNOSIS — Z85818 Personal history of malignant neoplasm of other sites of lip, oral cavity, and pharynx: Secondary | ICD-10-CM | POA: Diagnosis not present

## 2021-03-20 NOTE — Progress Notes (Signed)
Pt's Son Levada Dy has questions. Pls have the MD call pt's son at [phone]

## 2021-03-20 NOTE — Progress Notes (Signed)
Discharge instrutions given to pt and son. Both verbalized understanding of all teaching. Extra gauze given to pt to take home for bleeding in mouth.

## 2021-03-20 NOTE — Progress Notes (Signed)
Pt discharged to home with son via wheelchair with all belongings 

## 2021-03-20 NOTE — Discharge Summary (Signed)
Physician Discharge Summary  Patient ID: Meredith Stewart MRN: [id number] DOB/AGE: 04/15/1941 80 y.o.  Admit date: 03/19/2021 Discharge date: 03/20/2021  Admission Diagnoses: Alveolar cancer  Discharge Diagnoses:  Active Problems:   Caries   Periodontal disease   Oral cancer [facility])   Discharged Condition: good  Hospital Course: 80 year old female underwent dental extractions and marginal mandibulectomy for oral cancer.  See operative note.  She was observed overnight and did well, drinking well and with controlled pain.  With good family support, she is felt stable for discharge on POD 1.  Consults: None  Significant Diagnostic Studies: None  Treatments: surgery: See operative note  Discharge Exam: Blood pressure (!) 116/53, pulse 75, temperature 97.6 F (36.4 C), temperature source Oral, resp. rate 18, height '5\' 5"'$  (1.651 m), weight 44.2 kg, SpO2 97 %. General appearance: alert, cooperative, and no distress Throat: inferior alveolar incision intact, mild lower lip edema  Disposition: Discharge disposition: 01-Home or Self Care       Discharge Instructions     Diet - low sodium heart healthy   Complete by: As directed    Discharge instructions   Complete by: As directed    Drink plenty of fluids, advance diet as able.   Increase activity slowly   Complete by: As directed       Allergies as of 03/20/2021       Reactions   Doxycycline    Other reaction(s): Other (See Comments) Gets shaky all over   Erythromycin Hives   Nitroglycerin Other (See Comments)   Severe drop in b/p   Prednisone    Feet and Hands burn   Sulfa Antibiotics    Avoid due to macular degeneration        Medication List     TAKE these medications    acetaminophen 500 MG tablet Commonly known as: TYLENOL Take 500 mg by mouth every 6 (six) hours as needed for headache.   albuterol 108 (90 Base) MCG/ACT inhaler Commonly known as: VENTOLIN HFA Inhale 1 puff into the lungs every 4  (four) hours as needed for wheezing or shortness of breath.   amoxicillin 500 MG capsule Commonly known as: AMOXIL Take 2,000 mg by mouth See admin instructions. Take 2000 mg 1 hour prior to dental work   clindamycin 150 MG capsule Commonly known as: Cleocin Take 1 capsule (150 mg total) by mouth 4 (four) times daily for 10 days.   clonazePAM 0.5 MG tablet Commonly known as: KLONOPIN Take 0.5 mg by mouth 2 (two) times daily.   diphenhydrAMINE 25 MG tablet Commonly known as: BENADRYL Take 25 mg by mouth daily.   fluocinonide ointment 0.05 % Commonly known as: LIDEX Apply 1 application topically See admin instructions. Apply a thin layer to oral lesions 4 - 6 times daily as needed for mouth sores   Fluticasone-Salmeterol 250-50 MCG/DOSE Aepb Commonly known as: ADVAIR Inhale 1 puff into the lungs See admin instructions. Inhale 1 puff daily, may inhale a second dose as needed for shortness of breath   HYDROcodone-acetaminophen 7.5-325 MG tablet Commonly known as: Norco Take 1 tablet by mouth every 6 (six) hours as needed for moderate pain.   multivitamin-lutein Caps capsule Take 1 capsule by mouth every evening.   ondansetron 8 MG tablet Commonly known as: Zofran Take 1 tablet (8 mg total) by mouth every 8 (eight) hours as needed for nausea or vomiting.   pantoprazole 40 MG tablet Commonly known as: PROTONIX Take 40 mg by mouth  daily.   pravastatin 20 MG tablet Commonly known as: PRAVACHOL Take 20 mg by mouth daily with supper.   SYSTANE OP Place 1 drop into both eyes daily as needed (dry eyes).   Vitamin D 50 MCG (2000 UT) tablet Take 2,000 Units by mouth daily with supper.        Follow-up Information     Owsley, Whatley, DMD. Call in 1 week(s).   Specialty: Dentistry Why: As needed Contact information: [place] [id number]         [name], MD Follow up in 1 week(s).   Specialty: Otolaryngology Contact  information: [address] [place] [address] [phone]                 Signed: Melida Quitter 03/20/2021, 8:56 AM

## 2021-03-26 DIAGNOSIS — J449 Chronic obstructive pulmonary disease, unspecified: Secondary | ICD-10-CM | POA: Diagnosis not present

## 2021-03-26 DIAGNOSIS — C069 Malignant neoplasm of mouth, unspecified: Secondary | ICD-10-CM | POA: Diagnosis not present

## 2021-03-26 DIAGNOSIS — R011 Cardiac murmur, unspecified: Secondary | ICD-10-CM | POA: Diagnosis not present

## 2021-03-26 DIAGNOSIS — Z299 Encounter for prophylactic measures, unspecified: Secondary | ICD-10-CM | POA: Diagnosis not present

## 2021-03-26 DIAGNOSIS — Z681 Body mass index (BMI) 19 or less, adult: Secondary | ICD-10-CM | POA: Diagnosis not present

## 2021-04-01 LAB — SURGICAL PATHOLOGY

## 2021-04-08 ENCOUNTER — Other Ambulatory Visit: Payer: Self-pay

## 2021-04-08 ENCOUNTER — Encounter (HOSPITAL_COMMUNITY): Payer: Self-pay | Admitting: Otolaryngology

## 2021-04-08 NOTE — Progress Notes (Signed)
Spoke with pt.'s daughter-in-law Adylin Piske gave pre-op instructions.  Pt. Needs covid test Day of surgery.

## 2021-04-09 ENCOUNTER — Inpatient Hospital Stay (HOSPITAL_COMMUNITY): Payer: Medicare Other | Admitting: Certified Registered Nurse Anesthetist

## 2021-04-09 ENCOUNTER — Encounter (HOSPITAL_COMMUNITY)
Admission: RE | Disposition: A | Discharge status: Home / Self Care | Payer: Self-pay | Source: Home / Self Care | Attending: Otolaryngology

## 2021-04-09 ENCOUNTER — Encounter (HOSPITAL_COMMUNITY): Payer: Self-pay | Admitting: Otolaryngology

## 2021-04-09 ENCOUNTER — Other Ambulatory Visit: Payer: Self-pay

## 2021-04-09 ENCOUNTER — Inpatient Hospital Stay (HOSPITAL_COMMUNITY)
Admission: RE | Admit: 2021-04-09 | Discharge: 2021-04-12 | DRG: 142 | Disposition: A | Discharge status: Home / Self Care | Payer: Medicare Other | Attending: Otolaryngology | Admitting: Otolaryngology

## 2021-04-09 ENCOUNTER — Encounter (INDEPENDENT_AMBULATORY_CARE_PROVIDER_SITE_OTHER): Payer: Medicare Other | Admitting: Ophthalmology

## 2021-04-09 DIAGNOSIS — Z87891 Personal history of nicotine dependence: Secondary | ICD-10-CM | POA: Diagnosis not present

## 2021-04-09 DIAGNOSIS — Z85818 Personal history of malignant neoplasm of other sites of lip, oral cavity, and pharynx: Secondary | ICD-10-CM | POA: Diagnosis not present

## 2021-04-09 DIAGNOSIS — K1329 Other disturbances of oral epithelium, including tongue: Secondary | ICD-10-CM | POA: Diagnosis not present

## 2021-04-09 DIAGNOSIS — Z20822 Contact with and (suspected) exposure to covid-19: Secondary | ICD-10-CM | POA: Diagnosis present

## 2021-04-09 DIAGNOSIS — K219 Gastro-esophageal reflux disease without esophagitis: Secondary | ICD-10-CM | POA: Diagnosis present

## 2021-04-09 DIAGNOSIS — D490 Neoplasm of unspecified behavior of digestive system: Secondary | ICD-10-CM | POA: Diagnosis not present

## 2021-04-09 DIAGNOSIS — E785 Hyperlipidemia, unspecified: Secondary | ICD-10-CM | POA: Diagnosis present

## 2021-04-09 DIAGNOSIS — C069 Malignant neoplasm of mouth, unspecified: Secondary | ICD-10-CM | POA: Diagnosis present

## 2021-04-09 DIAGNOSIS — Z882 Allergy status to sulfonamides status: Secondary | ICD-10-CM

## 2021-04-09 DIAGNOSIS — Z888 Allergy status to other drugs, medicaments and biological substances status: Secondary | ICD-10-CM | POA: Diagnosis not present

## 2021-04-09 HISTORY — PX: RADICAL NECK DISSECTION: SHX2284

## 2021-04-09 LAB — SARS CORONAVIRUS 2 BY RT PCR (HOSPITAL ORDER, PERFORMED IN ~~LOC~~ HOSPITAL LAB): SARS Coronavirus 2: NEGATIVE

## 2021-04-09 SURGERY — DISSECTION, NECK, RADICAL
Anesthesia: General | Site: Neck | Laterality: Bilateral

## 2021-04-09 MED ORDER — ONDANSETRON HCL 4 MG/2ML IJ SOLN
INTRAMUSCULAR | Status: AC
  Filled 2021-04-09: qty 2

## 2021-04-09 MED ORDER — SUGAMMADEX SODIUM 200 MG/2ML IV SOLN
INTRAVENOUS | Status: DC | PRN
  Administered 2021-04-09: 200 mg via INTRAVENOUS

## 2021-04-09 MED ORDER — CLONAZEPAM 0.5 MG PO TABS
0.5000 mg | ORAL_TABLET | Freq: Two times a day (BID) | ORAL | Status: DC
  Administered 2021-04-09: 0.5 mg via ORAL
  Filled 2021-04-09 (×2): qty 1

## 2021-04-09 MED ORDER — EPHEDRINE SULFATE 50 MG/ML IJ SOLN
INTRAMUSCULAR | Status: DC | PRN
  Administered 2021-04-09: 10 mg via INTRAVENOUS

## 2021-04-09 MED ORDER — LACTATED RINGERS IV SOLN: INTRAVENOUS | Status: DC | PRN

## 2021-04-09 MED ORDER — MORPHINE SULFATE (PF) 2 MG/ML IV SOLN: 1.0000 mg | INTRAVENOUS | Status: DC | PRN

## 2021-04-09 MED ORDER — CHLORHEXIDINE GLUCONATE 0.12 % MT SOLN: 15.0000 mL | Freq: Once | OROMUCOSAL | Status: DC

## 2021-04-09 MED ORDER — HYDROCODONE-ACETAMINOPHEN 7.5-325 MG PO TABS
1.0000 | ORAL_TABLET | Freq: Four times a day (QID) | ORAL | Status: DC | PRN
  Administered 2021-04-09: 1 via ORAL
  Filled 2021-04-09: qty 1

## 2021-04-09 MED ORDER — FENTANYL CITRATE (PF) 250 MCG/5ML IJ SOLN
INTRAMUSCULAR | Status: DC | PRN
  Administered 2021-04-09: 50 ug via INTRAVENOUS
  Administered 2021-04-09: 100 ug via INTRAVENOUS
  Administered 2021-04-09 (×3): 50 ug via INTRAVENOUS

## 2021-04-09 MED ORDER — POLYETHYL GLYCOL-PROPYL GLYCOL 0.4-0.3 % OP GEL
Freq: Every day | OPHTHALMIC | Status: DC | PRN
  Filled 2021-04-09: qty 10

## 2021-04-09 MED ORDER — ALBUTEROL SULFATE (2.5 MG/3ML) 0.083% IN NEBU: 2.5000 mg | INHALATION_SOLUTION | RESPIRATORY_TRACT | Status: DC | PRN

## 2021-04-09 MED ORDER — DIPHENHYDRAMINE HCL 25 MG PO CAPS: 25.0000 mg | ORAL_CAPSULE | Freq: Every day | ORAL | Status: DC

## 2021-04-09 MED ORDER — MORPHINE BOLUS VIA INFUSION: 1.0000 mg | INTRAVENOUS | Status: DC | PRN

## 2021-04-09 MED ORDER — POLYVINYL ALCOHOL 1.4 % OP SOLN
1.0000 [drp] | OPHTHALMIC | Status: DC | PRN
  Filled 2021-04-09: qty 15

## 2021-04-09 MED ORDER — HYDROCODONE-ACETAMINOPHEN 7.5-325 MG PO TABS
ORAL_TABLET | ORAL | Status: AC
  Filled 2021-04-09: qty 1

## 2021-04-09 MED ORDER — FENTANYL CITRATE (PF) 250 MCG/5ML IJ SOLN
INTRAMUSCULAR | Status: AC
  Filled 2021-04-09: qty 5

## 2021-04-09 MED ORDER — PHENYLEPHRINE 40 MCG/ML (10ML) SYRINGE FOR IV PUSH (FOR BLOOD PRESSURE SUPPORT)
PREFILLED_SYRINGE | INTRAVENOUS | Status: DC | PRN
  Administered 2021-04-09 (×2): 80 ug via INTRAVENOUS

## 2021-04-09 MED ORDER — BACITRACIN ZINC 500 UNIT/GM EX OINT
TOPICAL_OINTMENT | CUTANEOUS | Status: AC
  Filled 2021-04-09: qty 28.35

## 2021-04-09 MED ORDER — ALBUMIN HUMAN 5 % IV SOLN: INTRAVENOUS | Status: DC | PRN

## 2021-04-09 MED ORDER — ACETAMINOPHEN 500 MG PO TABS: 500.0000 mg | ORAL_TABLET | Freq: Four times a day (QID) | ORAL | Status: DC | PRN

## 2021-04-09 MED ORDER — PRAVASTATIN SODIUM 10 MG PO TABS
20.0000 mg | ORAL_TABLET | Freq: Every day | ORAL | Status: DC
  Filled 2021-04-09: qty 2

## 2021-04-09 MED ORDER — 0.9 % SODIUM CHLORIDE (POUR BTL) OPTIME
TOPICAL | Status: DC | PRN
  Administered 2021-04-09: 1000 mL

## 2021-04-09 MED ORDER — ROCURONIUM BROMIDE 10 MG/ML (PF) SYRINGE
PREFILLED_SYRINGE | INTRAVENOUS | Status: DC | PRN
  Administered 2021-04-09: 20 mg via INTRAVENOUS
  Administered 2021-04-09: 10 mg via INTRAVENOUS
  Administered 2021-04-09: 60 mg via INTRAVENOUS

## 2021-04-09 MED ORDER — VITAMIN D 25 MCG (1000 UNIT) PO TABS
2000.0000 [IU] | ORAL_TABLET | Freq: Every day | ORAL | Status: DC
  Filled 2021-04-09: qty 2

## 2021-04-09 MED ORDER — FENTANYL CITRATE (PF) 100 MCG/2ML IJ SOLN
25.0000 ug | INTRAMUSCULAR | Status: DC | PRN
  Administered 2021-04-09 (×2): 25 ug via INTRAVENOUS

## 2021-04-09 MED ORDER — ONDANSETRON HCL 4 MG PO TABS
8.0000 mg | ORAL_TABLET | Freq: Three times a day (TID) | ORAL | Status: DC | PRN
  Filled 2021-04-09: qty 2

## 2021-04-09 MED ORDER — ONDANSETRON HCL 4 MG/2ML IJ SOLN
INTRAMUSCULAR | Status: DC | PRN
  Administered 2021-04-09: 4 mg via INTRAVENOUS

## 2021-04-09 MED ORDER — PHENYLEPHRINE 40 MCG/ML (10ML) SYRINGE FOR IV PUSH (FOR BLOOD PRESSURE SUPPORT)
PREFILLED_SYRINGE | INTRAVENOUS | Status: AC
  Filled 2021-04-09: qty 10

## 2021-04-09 MED ORDER — DEXTROSE-NACL 5-0.9 % IV SOLN: INTRAVENOUS | Status: DC

## 2021-04-09 MED ORDER — FENTANYL CITRATE (PF) 100 MCG/2ML IJ SOLN
INTRAMUSCULAR | Status: AC
  Filled 2021-04-09: qty 2

## 2021-04-09 MED ORDER — LACTATED RINGERS IV SOLN: INTRAVENOUS | Status: DC

## 2021-04-09 MED ORDER — ALBUTEROL SULFATE HFA 108 (90 BASE) MCG/ACT IN AERS: 1.0000 | INHALATION_SPRAY | RESPIRATORY_TRACT | Status: DC | PRN

## 2021-04-09 MED ORDER — ORAL CARE MOUTH RINSE: 15.0000 mL | Freq: Once | OROMUCOSAL | Status: DC

## 2021-04-09 MED ORDER — PROSIGHT PO TABS
1.0000 | ORAL_TABLET | Freq: Every day | ORAL | Status: DC
  Filled 2021-04-09: qty 1

## 2021-04-09 MED ORDER — ONDANSETRON HCL 4 MG/2ML IJ SOLN: 4.0000 mg | Freq: Once | INTRAMUSCULAR | Status: DC | PRN

## 2021-04-09 MED ORDER — PHENYLEPHRINE HCL-NACL 20-0.9 MG/250ML-% IV SOLN
INTRAVENOUS | Status: DC | PRN
Start: 2021-04-09 — End: 2021-04-09
  Administered 2021-04-09: 50 ug/min via INTRAVENOUS
  Administered 2021-04-09: 40 ug/min via INTRAVENOUS

## 2021-04-09 MED ORDER — PROPOFOL 10 MG/ML IV BOLUS
INTRAVENOUS | Status: DC | PRN
  Administered 2021-04-09: 100 mg via INTRAVENOUS
  Administered 2021-04-09: 30 mg via INTRAVENOUS

## 2021-04-09 MED ORDER — BACITRACIN ZINC 500 UNIT/GM EX OINT
1.0000 "application " | TOPICAL_OINTMENT | Freq: Three times a day (TID) | CUTANEOUS | Status: DC
  Administered 2021-04-09 – 2021-04-12 (×8): 1 via TOPICAL
  Filled 2021-04-09 (×3): qty 28.35

## 2021-04-09 MED ORDER — PANTOPRAZOLE SODIUM 40 MG PO TBEC
40.0000 mg | DELAYED_RELEASE_TABLET | Freq: Every day | ORAL | Status: DC
  Filled 2021-04-09: qty 1

## 2021-04-09 MED ORDER — MOMETASONE FURO-FORMOTEROL FUM 200-5 MCG/ACT IN AERO
2.0000 | INHALATION_SPRAY | Freq: Two times a day (BID) | RESPIRATORY_TRACT | Status: DC
  Administered 2021-04-10 – 2021-04-12 (×4): 2 via RESPIRATORY_TRACT
  Filled 2021-04-09 (×2): qty 8.8

## 2021-04-09 MED ORDER — HYDROCODONE-ACETAMINOPHEN 7.5-325 MG/15ML PO SOLN
10.0000 mL | Freq: Four times a day (QID) | ORAL | Status: DC | PRN
  Administered 2021-04-09 – 2021-04-11 (×6): 10 mL via ORAL
  Filled 2021-04-09 (×7): qty 15

## 2021-04-09 MED ORDER — PROPOFOL 10 MG/ML IV BOLUS
INTRAVENOUS | Status: AC
  Filled 2021-04-09: qty 20

## 2021-04-09 MED ORDER — BACITRACIN ZINC 500 UNIT/GM EX OINT
TOPICAL_OINTMENT | CUTANEOUS | Status: DC | PRN
  Administered 2021-04-09: 1 via TOPICAL

## 2021-04-09 MED ORDER — DEXAMETHASONE SODIUM PHOSPHATE 10 MG/ML IJ SOLN
INTRAMUSCULAR | Status: DC | PRN
  Administered 2021-04-09: 4 mg via INTRAVENOUS

## 2021-04-09 MED ORDER — LIDOCAINE 2% (20 MG/ML) 5 ML SYRINGE
INTRAMUSCULAR | Status: DC | PRN
  Administered 2021-04-09: 40 mg via INTRAVENOUS

## 2021-04-09 MED ORDER — OCUVITE-LUTEIN PO CAPS
1.0000 | ORAL_CAPSULE | Freq: Every evening | ORAL | Status: DC
  Filled 2021-04-09: qty 1

## 2021-04-09 MED ORDER — DIPHENHYDRAMINE HCL 25 MG PO TABS
25.0000 mg | ORAL_TABLET | Freq: Every day | ORAL | Status: DC
  Filled 2021-04-09: qty 1

## 2021-04-09 SURGICAL SUPPLY — 52 items
APPLIER CLIP 9.375 SM OPEN (CLIP)
APR CLP SM 9.3 20 MLT OPN (CLIP)
BAG COUNTER SPONGE SURGICOUNT (BAG) ×2 IMPLANT
BAG SPNG CNTER NS LX DISP (BAG) ×1
CANISTER SUCT 3000ML PPV (MISCELLANEOUS) ×2 IMPLANT
CLEANER TIP ELECTROSURG 2X2 (MISCELLANEOUS) ×2 IMPLANT
CLIP APPLIE 9.375 SM OPEN (CLIP) IMPLANT
CNTNR URN SCR LID CUP LEK RST (MISCELLANEOUS) IMPLANT
CONT SPEC 4OZ STRL OR WHT (MISCELLANEOUS)
CORD BIPOLAR FORCEPS 12FT (ELECTRODE) ×2 IMPLANT
COVER SURGICAL LIGHT HANDLE (MISCELLANEOUS) ×2 IMPLANT
DRAIN CHANNEL 15F RND FF W/TCR (WOUND CARE) IMPLANT
DRAIN SNY 10 ROU (WOUND CARE) IMPLANT
DRAIN WOUND SNY 15 RND (WOUND CARE) IMPLANT
DRAPE HALF SHEET 40X57 (DRAPES) IMPLANT
DRAPE INCISE 23X17 IOBAN STRL (DRAPES)
DRAPE INCISE 23X17 STRL (DRAPES) IMPLANT
DRAPE INCISE IOBAN 23X17 STRL (DRAPES) IMPLANT
ELECT COATED BLADE 2.86 ST (ELECTRODE) ×2 IMPLANT
ELECT REM PT RETURN 9FT ADLT (ELECTROSURGICAL) ×2
ELECTRODE REM PT RTRN 9FT ADLT (ELECTROSURGICAL) ×1 IMPLANT
EVACUATOR SILICONE 100CC (DRAIN) ×2 IMPLANT
FORCEPS BIPOLAR SPETZLER 8 1.0 (NEUROSURGERY SUPPLIES) ×2 IMPLANT
GAUZE 4X4 16PLY ~~LOC~~+RFID DBL (SPONGE) ×2 IMPLANT
GLOVE SURG ENC MOIS LTX SZ6.5 (GLOVE) ×2 IMPLANT
GLOVE SURG LTX SZ7.5 (GLOVE) ×2 IMPLANT
GOWN STRL REUS W/ TWL LRG LVL3 (GOWN DISPOSABLE) ×2 IMPLANT
GOWN STRL REUS W/TWL LRG LVL3 (GOWN DISPOSABLE) ×4
KIT BASIN OR (CUSTOM PROCEDURE TRAY) ×2 IMPLANT
KIT TURNOVER KIT B (KITS) ×2 IMPLANT
LOCATOR NERVE 3 VOLT (DISPOSABLE) IMPLANT
NDL PRECISIONGLIDE 27X1.5 (NEEDLE) ×1 IMPLANT
NEEDLE PRECISIONGLIDE 27X1.5 (NEEDLE) ×2 IMPLANT
NS IRRIG 1000ML POUR BTL (IV SOLUTION) ×2 IMPLANT
PAD ARMBOARD 7.5X6 YLW CONV (MISCELLANEOUS) ×4 IMPLANT
PENCIL FOOT CONTROL (ELECTRODE) ×2 IMPLANT
SHEARS HARMONIC 9CM CVD (BLADE) ×2 IMPLANT
SPECIMEN JAR MEDIUM (MISCELLANEOUS) IMPLANT
SPONGE INTESTINAL PEANUT (DISPOSABLE) IMPLANT
SPONGE T-LAP 18X18 ~~LOC~~+RFID (SPONGE) IMPLANT
STAPLER VISISTAT 35W (STAPLE) ×2 IMPLANT
SUT CHROMIC 3 0 SH 27 (SUTURE) ×2 IMPLANT
SUT CHROMIC 5 0 P 3 (SUTURE) IMPLANT
SUT ETHILON 3 0 PS 1 (SUTURE) ×2 IMPLANT
SUT ETHILON 5 0 PS 2 18 (SUTURE) IMPLANT
SUT SILK 2 0 REEL (SUTURE) IMPLANT
SUT SILK 3 0 SH CR/8 (SUTURE) ×2 IMPLANT
SUT SILK 4 0 REEL (SUTURE) ×3 IMPLANT
SUT VIC AB 3-0 SH 18 (SUTURE) IMPLANT
TRAY ENT MC OR (CUSTOM PROCEDURE TRAY) ×2 IMPLANT
TRAY FOLEY MTR SLVR 14FR STAT (SET/KITS/TRAYS/PACK) IMPLANT
TUBE FEEDING 10FR FLEXIFLO (MISCELLANEOUS) IMPLANT

## 2021-04-09 NOTE — Anesthesia Preprocedure Evaluation (Signed)
Anesthesia Evaluation  Patient identified by MRN, date of birth, ID band Patient awake    Reviewed: Allergy & Precautions, H&P , NPO status , Patient's Chart, lab work & pertinent test results  Airway Mallampati: II  TM Distance: >3 FB Neck ROM: Full    Dental no notable dental hx. (+) Poor Dentition, Dental Advisory Given, Missing,    Pulmonary asthma , former smoker,    Pulmonary exam normal breath sounds clear to auscultation       Cardiovascular Exercise Tolerance: Good + Valvular Problems/Murmurs MVP  Rhythm:Regular Rate:Normal     Neuro/Psych  Headaches, Anxiety Depression    GI/Hepatic Neg liver ROS, GERD  Medicated,  Endo/Other  negative endocrine ROS  Renal/GU negative Renal ROS  negative genitourinary   Musculoskeletal   Abdominal   Peds  Hematology negative hematology ROS (+)   Anesthesia Other Findings   Reproductive/Obstetrics negative OB ROS                             Anesthesia Physical  Anesthesia Plan  ASA: 3  Anesthesia Plan: General   Post-op Pain Management:    Induction: Intravenous  PONV Risk Score and Plan: 4 or greater and Dexamethasone and Treatment may vary due to age or medical condition  Airway Management Planned: Nasal ETT and Video Laryngoscope Planned  Additional Equipment:   Intra-op Plan:   Post-operative Plan: Extubation in OR and Post-operative intubation/ventilation  Informed Consent: I have reviewed the patients History and Physical, chart, labs and discussed the procedure including the risks, benefits and alternatives for the proposed anesthesia with the patient or authorized representative who has indicated his/her understanding and acceptance.     Dental advisory given  Plan Discussed with: CRNA  Anesthesia Plan Comments: (PAT note written 03/18/2021 by Myra Gianotti, PA-C. )        Anesthesia Quick Evaluation

## 2021-04-09 NOTE — Progress Notes (Signed)
Postop check  Complains of some pain at the surgical site.  Breathing and voice are clear.  All cranial nerves are functioning properly.  Incisions intact.  No hematoma or infection.  She does complain of headache.  She normally drinks a lot of coffee.  Recommend she have some coughing.  Plan 2 or 3-day stay depending on when the drain can be removed.

## 2021-04-09 NOTE — Anesthesia Procedure Notes (Signed)
Procedure Name: Intubation Date/Time: 04/09/2021 9:15 AM Performed by: Inda Coke, CRNA Pre-anesthesia Checklist: Patient identified, Emergency Drugs available, Suction available and Patient being monitored Patient Re-evaluated:Patient Re-evaluated prior to induction Oxygen Delivery Method: Circle System Utilized Preoxygenation: Pre-oxygenation with 100% oxygen Induction Type: IV induction Ventilation: Mask ventilation without difficulty Laryngoscope Size: Mac and 3 Grade View: Grade I Tube type: Oral Tube size: 7.0 mm Number of attempts: 1 Airway Equipment and Method: Stylet and Oral airway Placement Confirmation: ETT inserted through vocal cords under direct vision, positive ETCO2 and breath sounds checked- equal and bilateral Secured at: 20 cm Tube secured with: Tape Dental Injury: Teeth and Oropharynx as per pre-operative assessment

## 2021-04-09 NOTE — Op Note (Signed)
OPERATIVE REPORT  DATE OF SURGERY: 04/09/2021  PATIENT:  Meredith Stewart,  80 y.o. female  PRE-OPERATIVE DIAGNOSIS:  Erythroplakia of mouth; Verrucous carcinoma of oral cavity; Oral cancer  POST-OPERATIVE DIAGNOSIS:  Erythroplakia of mouth; Verrucous carcinoma of oral cavity; Oral cancer  PROCEDURE:  Procedure(s): BILATERAL NECK DISSECTION  SURGEON:  Beckie Salts, MD  ASSISTANTS: RNFA  ANESTHESIA:   General   EBL: 100 ml  DRAINS: 15 French round JP  LOCAL MEDICATIONS USED:  None  SPECIMEN: Bilateral selective neck dissection including level 1, 2 and 3.  COUNTS:  Correct  PROCEDURE DETAILS: The patient was taken to the operating room and placed on the operating table in the supine position. Following induction of general endotracheal anesthesia, the neck was prepped and draped in a standard fashion.  A full apron incision was outlined with a marking pen.  Electrocautery was used to incise the skin and subcutaneous tissue and through the platysma layer.  Subplatysmal flaps were developed superiorly to the mandible and inferiorly to just below the omohyoid muscle.  The right side was dissected first.  The marginal mandibular branch of the facial nerve was identified and preserved and reflected superiorly.  Facial nodes were dissected inferiorly.  Facial vessels were ligated between clamps and divided.  The submandibular gland was brought inferiorly exposing the omohyoid muscle.  This was reflected up superiorly and the submandibular ganglion and submandibular duct were ligated between clamps and divided.  The submental fat pad was dissected and brought down as well.  The hypoglossal nerve was identified and preserved.  The fascia overlying the sternocleidomastoid muscle was reflected anteriorly.  The greater regular nerve was preserved.  The fascial tissue was then dissected off the medial side of the muscle.  The spinal accessory nerve was identified and dissected up towards the digastric.   The lymph node bearing tissue in the posterior superior triangle behind the nerve was dissected down to the splenius capitis and levator scapular muscles and brought inferiorly under the nerve and brought forward.  Fibrofatty tissue was then dissected off the cutaneous branch of the cervical plexus and brought forward exposing the internal jugular vein.  All fibrofatty tissue was dissected off the internal jugular vein preserving the vagus nerve and the carotid system.  The lower limit of dissection on the right was the omohyoid muscle which was preserved.  The specimen was dissected off of the strap muscles and brought anteriorly.  A similar procedure was performed on the left side.  All of the same nerves were preserved on the left.  The internal jugular was preserved.  The right internal jugular was dominant.  There were some nodes identified in the level 4 area on the left and these were included with the level 3 dissection.  Lymphatic ducts were ligated between clamps and divided.  The entire specimen was delivered oriented on a towel and sent for pathologic evaluation.  Electrocautery and bipolar cautery were used for completion of hemostasis.  The drain was exited through a separate stab incision and secured in place with nylon suture.  There is no evidence of chyle leak.  Incision was reapproximated with interrupted 3-0 chromic on the platysmal layer and staples on the skin.  Bacitracin was applied.  Patient was awakened extubated and transferred recovery in stable condition.    PATIENT DISPOSITION:  To PACU, stable

## 2021-04-09 NOTE — Anesthesia Procedure Notes (Signed)
Arterial Line Insertion Start/End9/16/2022 8:05 AM Performed by: Janene Harvey, CRNA  Patient location: Pre-op. Preanesthetic checklist: patient identified, IV checked, risks and benefits discussed, surgical consent, pre-op evaluation and timeout performed Lidocaine 1% used for infiltration Right, radial was placed Catheter size: 20 G Hand hygiene performed  and maximum sterile barriers used  Allen's test indicative of satisfactory collateral circulation Attempts: 1 Procedure performed without using ultrasound guided technique. Following insertion, dressing applied and Biopatch. Post procedure assessment: unchanged  Patient tolerated the procedure well with no immediate complications.

## 2021-04-09 NOTE — Interval H&P Note (Signed)
History and Physical Interval Note:  04/09/2021 8:41 AM  Meredith Stewart  has presented today for surgery, with the diagnosis of Erythroplakia of mouth; Verrucous carcinoma of oral cavity; Oral cancer.  The various methods of treatment have been discussed with the patient and family. After consideration of risks, benefits and other options for treatment, the patient has consented to  Procedure(s): BILATERAL NECK DISSECTION (Bilateral) as a surgical intervention.  The patient's history has been reviewed, patient examined, no change in status, stable for surgery.  I have reviewed the patient's chart and labs.  Questions were answered to the patient's satisfaction.     [name]

## 2021-04-09 NOTE — Interval H&P Note (Signed)
History and Physical Interval Note:  04/09/2021 7:19 AM  Meredith Stewart  has presented today for surgery, with the diagnosis of Erythroplakia of mouth; Verrucous carcinoma of oral cavity; Oral cancer.  The various methods of treatment have been discussed with the patient and family. After consideration of risks, benefits and other options for treatment, the patient has consented to  Procedure(s): BILATERAL NECK DISSECTION (Bilateral) as a surgical intervention.  The patient's history has been reviewed, patient examined, no change in status, stable for surgery.  I have reviewed the patient's chart and labs.  Questions were answered to the patient's satisfaction.     [name]

## 2021-04-09 NOTE — Anesthesia Postprocedure Evaluation (Signed)
Anesthesia Post Note  Patient: Meredith Stewart  Procedure(s) Performed: BILATERAL NECK DISSECTION (Bilateral: Neck)     Patient location during evaluation: PACU Anesthesia Type: General Level of consciousness: awake and alert and oriented Pain management: pain level controlled Vital Signs Assessment: post-procedure vital signs reviewed and stable Respiratory status: spontaneous breathing, nonlabored ventilation and respiratory function stable Cardiovascular status: blood pressure returned to baseline and stable Postop Assessment: no apparent nausea or vomiting Anesthetic complications: no   No notable events documented.  Last Vitals:  Vitals:   04/09/21 1252 04/09/21 1307  BP: (!) 142/71 140/73  Pulse: (!) 54 69  Resp: 13 15  Temp:    SpO2: 100% 100%    Last Pain:  Vitals:   04/09/21 1237  TempSrc:   PainSc: Asleep                 Solomiya Pascale A.

## 2021-04-09 NOTE — Transfer of Care (Signed)
Immediate Anesthesia Transfer of Care Note  Patient: Meredith Stewart  Procedure(s) Performed: BILATERAL NECK DISSECTION (Bilateral: Neck)  Patient Location: PACU  Anesthesia Type:General  Level of Consciousness: awake and drowsy  Airway & Oxygen Therapy: Patient Spontanous Breathing and Patient connected to face mask oxygen  Post-op Assessment: Report given to RN and Post -op Vital signs reviewed and stable  Post vital signs: Reviewed and stable  Last Vitals:  Vitals Value Taken Time  BP 138/64 04/09/21 1236  Temp    Pulse 66 04/09/21 1239  Resp 12 04/09/21 1239  SpO2 100 % 04/09/21 1239  Vitals shown include unvalidated device data.  Last Pain:  Vitals:   04/09/21 0718  TempSrc:   PainSc: 0-No pain         Complications: No notable events documented.

## 2021-04-10 ENCOUNTER — Encounter (HOSPITAL_COMMUNITY): Payer: Self-pay | Admitting: Otolaryngology

## 2021-04-10 MED ORDER — CLONAZEPAM 0.5 MG PO TABS
0.5000 mg | ORAL_TABLET | Freq: Two times a day (BID) | ORAL | Status: DC
  Administered 2021-04-10 – 2021-04-12 (×3): 0.5 mg via ORAL
  Filled 2021-04-10 (×3): qty 1

## 2021-04-10 MED ORDER — PANTOPRAZOLE 2 MG/ML SUSPENSION
20.0000 mg | Freq: Every day | ORAL | Status: DC
  Administered 2021-04-10 – 2021-04-12 (×3): 20 mg via ORAL
  Filled 2021-04-10 (×3): qty 20

## 2021-04-10 MED ORDER — ONDANSETRON 4 MG PO TBDP
4.0000 mg | ORAL_TABLET | Freq: Three times a day (TID) | ORAL | Status: DC | PRN
  Administered 2021-04-10: 4 mg via ORAL
  Filled 2021-04-10: qty 1

## 2021-04-10 MED ORDER — DIPHENHYDRAMINE HCL 12.5 MG/5ML PO ELIX
25.0000 mg | ORAL_SOLUTION | Freq: Every day | ORAL | Status: DC
  Administered 2021-04-10 – 2021-04-12 (×3): 25 mg via ORAL
  Filled 2021-04-10 (×3): qty 10

## 2021-04-10 NOTE — Progress Notes (Signed)
Subjective: Better this morning  Objective: Vital signs in last 24 hours: Temp:  [97.3 F (36.3 C)-97.9 F (36.6 C)] 97.9 F (36.6 C) (09/17 0444) Pulse Rate:  [54-84] 81 (09/17 0444) Resp:  [8-18] 18 (09/17 0444) BP: (105-147)/(51-73) 105/59 (09/17 0444) SpO2:  [94 %-100 %] 100 % (09/17 0444) Arterial Line BP: (139-148)/(62-68) 139/62 (09/16 1441) Weight change:  Last BM Date: 04/09/21  Intake/Output from previous day: 09/16 0701 - 09/17 0700 In: 2596.8 [P.O.:180; I.V.:2141.8; IV Piggyback:250] Out: 961 [Urine:800; Drains:86; Blood:75] Intake/Output this shift: No intake/output data recorded.  PHYSICAL EXAM: Awake and alert, neck incisions intact, flaps are down and the drain is functioning.  Lab Results: No results for input(s): WBC, HGB, HCT, PLT in the last 72 hours. BMET No results for input(s): NA, K, CL, CO2, GLUCOSE, BUN, CREATININE, CALCIUM in the last 72 hours.  Studies/Results: No results found.  Medications: I have reviewed the patient's current medications.  Assessment/Plan: Stable POD 1, continue inpatient care until the drain comes out.  LOS: 1 day   Meredith Stewart 04/10/2021, 8:19 AM

## 2021-04-11 MED ORDER — ENSURE ENLIVE PO LIQD
237.0000 mL | Freq: Three times a day (TID) | ORAL | Status: DC
  Administered 2021-04-11 – 2021-04-12 (×2): 237 mL via ORAL

## 2021-04-11 NOTE — Progress Notes (Signed)
Subjective: Feeling much better today.  Much less pain.  Swallowing better.  Objective: Vital signs in last 24 hours: Temp:  [97.7 F (36.5 C)-98.1 F (36.7 C)] 97.8 F (36.6 C) (09/18 0849) Pulse Rate:  [76-80] 79 (09/18 0849) Resp:  [17-18] 18 (09/18 0849) BP: (125-135)/(61-78) 133/74 (09/18 0849) SpO2:  [96 %-99 %] 98 % (09/18 0849) Weight change:  Last BM Date: 04/09/21  Intake/Output from previous day: 09/17 0701 - 09/18 0700 In: 1140.4 [P.O.:120; I.V.:1020.4] Out: 30 [Drains:30] Intake/Output this shift: No intake/output data recorded.  PHYSICAL EXAM: Awake and alert.  Family member in the room.  She is talking a little bit clear.  Neck looks excellent.  No swelling.  Lab Results: No results for input(s): WBC, HGB, HCT, PLT in the last 72 hours. BMET No results for input(s): NA, K, CL, CO2, GLUCOSE, BUN, CREATININE, CALCIUM in the last 72 hours.  Studies/Results: No results found.  Medications: I have reviewed the patient's current medications.  Assessment/Plan: Stable postop.  Progressing nicely.  Drain output is reducing significantly and I suspect we will be ready to remove the drain and discharge her tomorrow.  LOS: 2 days   Meredith Stewart 04/11/2021, 11:00 AM

## 2021-04-11 NOTE — Progress Notes (Signed)
IV accidentally pulled out, Dr. Constance Holster made aware , ordered to leave it out.

## 2021-04-11 NOTE — Progress Notes (Signed)
Initial Nutrition Assessment  DOCUMENTATION CODES:   Underweight  INTERVENTION:  Provide Ensure Enlive po TID, each supplement provides 350 kcal and 20 grams of protein.  Encourage adequate PO intake.   NUTRITION DIAGNOSIS:   Increased nutrient needs related to post-op healing as evidenced by estimated needs.  GOAL:   Patient will meet greater than or equal to 90% of their needs  MONITOR:   PO intake, Supplement acceptance, Skin, Weight trends, Labs, I & O's  REASON FOR ASSESSMENT:   Malnutrition Screening Tool    ASSESSMENT:   80 year old female with the diagnosis of Verrucous carcinoma of oral cavity; Erythroplakia of mouth; Tumor of oral cavity.  9/16 - s/p bilateral neck disscetion  Pt unavailable during attempted time of contact. Meal completion has been 25%. Per MD, pt with less pain and swallowing has improved. Possible plans to remove drain with discharge tomorrow. RD to order nutritional supplements to aid in caloric and protein needs. Unable to complete Nutrition-Focused physical exam at this time.   Labs and medications reviewed.   Diet Order:   Diet Order             DIET SOFT Room service appropriate? Yes; Fluid consistency: Thin  Diet effective now                   EDUCATION NEEDS:   Not appropriate for education at this time  Skin:  Skin Assessment: Skin Integrity Issues: Skin Integrity Issues:: Incisions Incisions: neck  Last BM:  9/16  Height:   Ht Readings from Last 1 Encounters:  04/09/21 '5\' 5"'$  (1.651 m)    Weight:   Wt Readings from Last 1 Encounters:  04/09/21 42.8 kg    Ideal Body Weight:  56.8 kg  BMI:  Body mass index is 15.71 kg/m.  Estimated Nutritional Needs:   Kcal:  1700-1900  Protein:  85-95 grams  Fluid:  >/= 1.7 L/day  Corrin Parker, MS, RD, LDN RD pager number/after hours weekend pager number on Amion.

## 2021-04-12 MED ORDER — HYDROCODONE-ACETAMINOPHEN 7.5-325 MG/15ML PO SOLN: 10.0000 mL | Freq: Four times a day (QID) | ORAL | 0 refills | Status: AC | PRN

## 2021-04-12 NOTE — Care Management Important Message (Signed)
Important Message  Patient Details  Name: Meredith Stewart MRN: [id number] Date of Birth: July 24, 1941   Medicare Important Message Given:  Yes  Patient left prior to IM delivery will mail to the patient home address.    Kinley Ferrentino 04/12/2021, 4:24 PM

## 2021-04-12 NOTE — Discharge Summary (Signed)
Physician Discharge Summary  Patient ID: MERCADES BAJAJ MRN: [id number] DOB/AGE: [birth year] 80 y.o.  Admit date: 04/09/2021 Discharge date: 04/12/2021  Admission Diagnoses: Oral cancer  Discharge Diagnoses:  Active Problems:   Oral cancer [facility])   Discharged Condition: good  Hospital Course: No complications  Consults: none  Significant Diagnostic Studies: none  Treatments: surgery: Bilateral neck dissection  Discharge Exam: Blood pressure 126/81, pulse 95, temperature 97.7 F (36.5 C), resp. rate 17, height 5\' 5"  (1.651 m), weight 42.8 kg, SpO2 97 %. PHYSICAL EXAM: She is awake and alert.  Incision looks excellent.  The drain was removed.  Disposition: Discharge disposition: 01-Home or Self Care       Discharge Instructions     Diet - low sodium heart healthy   Complete by: As directed    Discharge wound care:   Complete by: As directed    Keep the dressing on the drain site if there is any drainage, once there is no more drainage you do not need to keep the dressing there.  Apply antibiotic ointment to the staple line twice daily.   Increase activity slowly   Complete by: As directed       Allergies as of 04/12/2021       Reactions   Doxycycline    Other reaction(s): Other (See Comments) Gets shaky all over   Erythromycin Hives   Nitroglycerin Other (See Comments)   Severe drop in b/p   Prednisone    Feet and Hands burn   Sulfa Antibiotics    Avoid due to macular degeneration        Medication List     TAKE these medications    acetaminophen 500 MG tablet Commonly known as: TYLENOL Take 500 mg by mouth every 6 (six) hours as needed for headache.   albuterol 108 (90 Base) MCG/ACT inhaler Commonly known as: VENTOLIN HFA Inhale 1 puff into the lungs every 4 (four) hours as needed for wheezing or shortness of breath.   amoxicillin 500 MG capsule Commonly known as: AMOXIL Take 2,000 mg by mouth See admin instructions. Take 2000 mg 1 hour prior  to dental work   clonazePAM 0.5 MG tablet Commonly known as: KLONOPIN Take 0.5 mg by mouth 2 (two) times daily.   diphenhydrAMINE 25 MG tablet Commonly known as: BENADRYL Take 25 mg by mouth daily.   fluocinonide ointment 0.05 % Commonly known as: LIDEX Apply 1 application topically See admin instructions. Apply a thin layer to oral lesions 4 - 6 times daily as needed for mouth sores   Fluticasone-Salmeterol 250-50 MCG/DOSE Aepb Commonly known as: ADVAIR Inhale 1 puff into the lungs See admin instructions. Inhale 1 puff daily, may inhale a second dose as needed for shortness of breath   HYDROcodone-acetaminophen 7.5-325 MG tablet Commonly known as: Norco Take 1 tablet by mouth every 6 (six) hours as needed for moderate pain. What changed: Another medication with the same name was added. Make sure you understand how and when to take each.   HYDROcodone-acetaminophen 7.5-325 mg/15 ml solution Commonly known as: HYCET Take 10 mLs by mouth every 6 (six) hours as needed for up to 7 days for moderate pain. What changed: You were already taking a medication with the same name, and this prescription was added. Make sure you understand how and when to take each.   multivitamin-lutein Caps capsule Take 1 capsule by mouth every evening.   ondansetron 8 MG tablet Commonly known as: Zofran Take 1 tablet (8 mg  total) by mouth every 8 (eight) hours as needed for nausea or vomiting.   pantoprazole 40 MG tablet Commonly known as: PROTONIX Take 40 mg by mouth daily.   pravastatin 20 MG tablet Commonly known as: PRAVACHOL Take 20 mg by mouth daily with supper.   SYSTANE OP Place 1 drop into both eyes daily as needed (dry eyes).   Vitamin D 50 MCG (2000 UT) tablet Take 2,000 Units by mouth daily with supper.               Discharge Care Instructions  (From admission, onward)           Start     Ordered   04/12/21 0000  Discharge wound care:       Comments: Keep the  dressing on the drain site if there is any drainage, once there is no more drainage you do not need to keep the dressing there.  Apply antibiotic ointment to the staple line twice daily.   04/12/21 1006            Follow-up Information     [name], MD Follow up in 1 week(s).   Specialty: Otolaryngology Contact information: [address] [place] [address] [phone]                 Signed: [name] 04/12/2021, 10:08 AM

## 2021-04-12 NOTE — Care Management Important Message (Signed)
Important Message  Patient Details  Name: Meredith Stewart MRN: [id number] Date of Birth: May 07, 1941   Medicare Important Message Given:  Yes     Memory Argue 04/12/2021, 1:34 PM

## 2021-04-13 LAB — SURGICAL PATHOLOGY

## 2021-04-14 ENCOUNTER — Other Ambulatory Visit: Payer: Self-pay

## 2021-04-14 DIAGNOSIS — C069 Malignant neoplasm of mouth, unspecified: Secondary | ICD-10-CM

## 2021-04-14 NOTE — Progress Notes (Signed)
Oncology Nurse Navigator Documentation   Placed introductory call to new referral patient Meredith Stewart. I spoke with her daughter-in-law, Lattie Haw who cares for her.  Introduced myself as the H&N oncology nurse navigator that works with Dr. Isidore Moos to whom she has been referred by Dr. Constance Holster. She confirmed understanding of referral. Briefly explained my role as her navigator, provided my contact information.  Confirmed understanding of upcoming appts and [place] location, explained arrival and registration process. I explained that although they have seen Dr. Benson Norway for teeth extractions recently, Ms. Miranda may have to return to her for evaluation before possible radiation treatment.  I encouraged her to call with questions/concerns as she moves forward with appts and procedures.   She verbalized understanding of information provided, expressed appreciation for my call.   Navigator Initial Assessment Employment Status: she is retired Currently on [employer] / STD:na Living Situation: She lives in a home next door to her son and daughter-in-law.  Support System: Son and daughter-in-law PCP: Monico Blitz MD PCD: na. She has seen Dr. Benson Norway for dental extractions.  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  Language Barriers/Interpreter Needed:  no Ambulation Needs: no DME Used in Home: no Psychosocial Needs:  no Concerns/Needs Understanding Cancer:  addressed/answered by navigator to best of ability Self-Expressed Needs: no   Harlow Asa RN, BSN, OCN Head & Neck Oncology Nurse [place] at [facility] Phone # [phone]  Fax # [phone]

## 2021-04-16 NOTE — Progress Notes (Signed)
Oncology Nurse Navigator Documentation   I received a message from a scheduler for Dr. Isidore Moos that when she called Ms. Stange to schedule a consult she declined to be scheduled. I called and spoke to Ms. Arno and her son who was present as well on speaker phone. Ms. Pigeon again declined to schedule a consult with Dr. Isidore Moos. I explained that Dr. Constance Holster had referred her because of her positive biopsy for [facility] of her mandible and radiation was recommended for her best chance of cure for her disease. She indicated that a close family member had radiation in the past and she would not agree to radiation after that experience. I encouraged to her to continue to follow up with Dr. Constance Holster for examinations and she agreed. I have also notified Dr. Isidore Moos and Dr. Constance Holster of the above conversation. We will close out her referral at this time and scheduling is aware.   Harlow Asa RN, BSN, OCN Head & Neck Oncology Nurse [place] at [facility] Phone # [phone]  Fax # [phone]

## 2021-04-20 DIAGNOSIS — F32A Depression, unspecified: Secondary | ICD-10-CM | POA: Diagnosis not present

## 2021-04-20 DIAGNOSIS — Z713 Dietary counseling and surveillance: Secondary | ICD-10-CM | POA: Diagnosis not present

## 2021-04-20 DIAGNOSIS — K5900 Constipation, unspecified: Secondary | ICD-10-CM | POA: Diagnosis not present

## 2021-04-20 DIAGNOSIS — Z299 Encounter for prophylactic measures, unspecified: Secondary | ICD-10-CM | POA: Diagnosis not present

## 2021-04-20 DIAGNOSIS — C069 Malignant neoplasm of mouth, unspecified: Secondary | ICD-10-CM | POA: Diagnosis not present

## 2021-04-21 ENCOUNTER — Ambulatory Visit (HOSPITAL_COMMUNITY): Payer: Medicare Other

## 2021-05-07 DIAGNOSIS — Z20828 Contact with and (suspected) exposure to other viral communicable diseases: Secondary | ICD-10-CM | POA: Diagnosis not present

## 2021-05-12 DIAGNOSIS — L110 Acquired keratosis follicularis: Secondary | ICD-10-CM | POA: Diagnosis not present

## 2021-05-12 DIAGNOSIS — I739 Peripheral vascular disease, unspecified: Secondary | ICD-10-CM | POA: Diagnosis not present

## 2021-05-12 DIAGNOSIS — M79672 Pain in left foot: Secondary | ICD-10-CM | POA: Diagnosis not present

## 2021-05-12 DIAGNOSIS — M79675 Pain in left toe(s): Secondary | ICD-10-CM | POA: Diagnosis not present

## 2021-05-12 DIAGNOSIS — M79671 Pain in right foot: Secondary | ICD-10-CM | POA: Diagnosis not present

## 2021-05-12 DIAGNOSIS — M79674 Pain in right toe(s): Secondary | ICD-10-CM | POA: Diagnosis not present

## 2021-05-13 DIAGNOSIS — J449 Chronic obstructive pulmonary disease, unspecified: Secondary | ICD-10-CM | POA: Diagnosis not present

## 2021-05-13 DIAGNOSIS — Z299 Encounter for prophylactic measures, unspecified: Secondary | ICD-10-CM | POA: Diagnosis not present

## 2021-05-13 DIAGNOSIS — F339 Major depressive disorder, recurrent, unspecified: Secondary | ICD-10-CM | POA: Diagnosis not present

## 2021-05-13 DIAGNOSIS — Z681 Body mass index (BMI) 19 or less, adult: Secondary | ICD-10-CM | POA: Diagnosis not present

## 2021-05-13 DIAGNOSIS — C069 Malignant neoplasm of mouth, unspecified: Secondary | ICD-10-CM | POA: Diagnosis not present

## 2021-05-13 DIAGNOSIS — E43 Unspecified severe protein-calorie malnutrition: Secondary | ICD-10-CM | POA: Diagnosis not present

## 2021-05-13 DIAGNOSIS — Z1331 Encounter for screening for depression: Secondary | ICD-10-CM | POA: Diagnosis not present

## 2021-06-03 DIAGNOSIS — L438 Other lichen planus: Secondary | ICD-10-CM | POA: Diagnosis not present

## 2021-06-09 DIAGNOSIS — Z20828 Contact with and (suspected) exposure to other viral communicable diseases: Secondary | ICD-10-CM | POA: Diagnosis not present

## 2021-06-15 DIAGNOSIS — Z85818 Personal history of malignant neoplasm of other sites of lip, oral cavity, and pharynx: Secondary | ICD-10-CM | POA: Diagnosis not present

## 2021-06-15 DIAGNOSIS — K1321 Leukoplakia of oral mucosa, including tongue: Secondary | ICD-10-CM | POA: Diagnosis not present

## 2021-07-09 DIAGNOSIS — Z20828 Contact with and (suspected) exposure to other viral communicable diseases: Secondary | ICD-10-CM | POA: Diagnosis not present

## 2021-07-13 DIAGNOSIS — L438 Other lichen planus: Secondary | ICD-10-CM | POA: Diagnosis not present

## 2021-08-04 DIAGNOSIS — I739 Peripheral vascular disease, unspecified: Secondary | ICD-10-CM | POA: Diagnosis not present

## 2021-08-04 DIAGNOSIS — L110 Acquired keratosis follicularis: Secondary | ICD-10-CM | POA: Diagnosis not present

## 2021-08-04 DIAGNOSIS — M79671 Pain in right foot: Secondary | ICD-10-CM | POA: Diagnosis not present

## 2021-08-04 DIAGNOSIS — M79672 Pain in left foot: Secondary | ICD-10-CM | POA: Diagnosis not present

## 2021-08-04 DIAGNOSIS — M79674 Pain in right toe(s): Secondary | ICD-10-CM | POA: Diagnosis not present

## 2021-08-04 DIAGNOSIS — M79675 Pain in left toe(s): Secondary | ICD-10-CM | POA: Diagnosis not present

## 2021-08-09 DIAGNOSIS — Z20828 Contact with and (suspected) exposure to other viral communicable diseases: Secondary | ICD-10-CM | POA: Diagnosis not present

## 2021-08-12 DIAGNOSIS — F339 Major depressive disorder, recurrent, unspecified: Secondary | ICD-10-CM | POA: Diagnosis not present

## 2021-08-12 DIAGNOSIS — Z681 Body mass index (BMI) 19 or less, adult: Secondary | ICD-10-CM | POA: Diagnosis not present

## 2021-08-12 DIAGNOSIS — Z87891 Personal history of nicotine dependence: Secondary | ICD-10-CM | POA: Diagnosis not present

## 2021-08-12 DIAGNOSIS — J449 Chronic obstructive pulmonary disease, unspecified: Secondary | ICD-10-CM | POA: Diagnosis not present

## 2021-08-12 DIAGNOSIS — C069 Malignant neoplasm of mouth, unspecified: Secondary | ICD-10-CM | POA: Diagnosis not present

## 2021-08-12 DIAGNOSIS — Z299 Encounter for prophylactic measures, unspecified: Secondary | ICD-10-CM | POA: Diagnosis not present

## 2021-08-17 DIAGNOSIS — K1321 Leukoplakia of oral mucosa, including tongue: Secondary | ICD-10-CM | POA: Diagnosis not present

## 2021-08-17 DIAGNOSIS — C069 Malignant neoplasm of mouth, unspecified: Secondary | ICD-10-CM | POA: Diagnosis not present

## 2021-08-20 ENCOUNTER — Encounter (HOSPITAL_BASED_OUTPATIENT_CLINIC_OR_DEPARTMENT_OTHER): Payer: Self-pay | Admitting: Otolaryngology

## 2021-08-23 NOTE — Progress Notes (Signed)
Patient's daughter in law called and states patient is sick and needs to reschedule surgery. Patient's daughter in law states they informed Dr. Janeice Robinson office.

## 2021-08-25 ENCOUNTER — Ambulatory Visit (HOSPITAL_BASED_OUTPATIENT_CLINIC_OR_DEPARTMENT_OTHER): Admit: 2021-08-25 | Payer: Medicare Other | Admitting: Otolaryngology

## 2021-08-25 SURGERY — DESTRUCTION, LESION, MOUTH, USING CO2 LASER
Anesthesia: Monitor Anesthesia Care

## 2021-09-09 NOTE — H&P (Signed)
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 female who presents as a return Patient.   Current problem: Oral cancer.  HPI: Doing much better. She started gaining some weight back. She is getting her strength back, walking with a walker. She is accompanied by her son. No new complaints.  PMH/Meds/All/SocHx/FamHx/ROS:   Past Medical History:  Diagnosis Date   Asthma   Erythroplakia of mouth   Headache(784.0)   Heartburn   Oral cancer ([place])   Oral lesion   Osteoporosis   Skin cancer   Past Surgical History:  Procedure Laterality Date   CATARACT EXTRACTION   DILATION AND CURETTAGE, DIAGNOSTIC / THERAPEUTIC   excision of oral cancer   excision of oral lesion   excision of skin cancer   EXCISION ORAL LESION W/ LASER 11/09/2015   oral biopsy   No family history of bleeding disorders, wound healing problems or difficulty with anesthesia.   Social History   Socioeconomic History   Marital status: Married  Spouse name: Not on file   Number of children: Not on file   Years of education: Not on file   Highest education level: Not on file  Occupational History   Not on file  Tobacco Use   Smoking status: Former  Packs/day: 0.00  Types: Cigarettes  Quit date: 68  Years since quitting: 25.0   Smokeless tobacco: Never  Vaping Use   Vaping Use: Never used  Substance and Sexual Activity   Alcohol use: No   Drug use: Not Currently   Sexual activity: Not on file  Other Topics Concern   Not on file  Social History Narrative   Not on file   Social Determinants of Health   Financial Resource Strain: Not on file  Food Insecurity: Not on file  Transportation Needs: Not on file  Physical Activity: Not on file  Stress: Not on file  Social  Connections: Not on file  Housing Stability: Not on file   Current Outpatient Medications:   albuterol 90 mcg/actuation inhaler, Inhale 1 puff into the lungs., Disp: , Rfl:   aspirin 325 MG tablet *ANTIPLATELET*, Take 1 tablet (325 mg total) by mouth., Disp: , Rfl:   calcium carbonate (OS-CAL) 600 mg calcium (1,500 mg) tablet, Take by mouth., Disp: , Rfl:   cetirizine (ZYRTEC) 10 MG tablet, TAKE 1 TABLET BY MOUTH EVERY DAY, Disp: , Rfl:   cholecalciferol, vitamin D3, 2,000 unit Tab, Take 1 tablet (2,000 Units total) by mouth., Disp: , Rfl:   clobetasol (TEMOVATE) 0.05 % ointment, APPLY ONE APPLICATION ON TO THE SKIN AS DIRECTED, Disp: , Rfl: 2  clonazePAM (KLONOPIN) 0.5 MG tablet, TAKE 1 TABLET BY MOUTH EVERY DAY AS NEEDED, Disp: , Rfl:   diphenhydrAMINE (BENADRYL) 25 mg tablet, Take 1 tablet (25 mg total) by mouth., Disp: , Rfl:   escitalopram oxalate (LEXAPRO) 10 MG tablet, Take 1 tablet (10 mg total) by mouth daily., Disp: , Rfl:   fluocinonide (LIDEX) 0.05 % ointment, apply thin layer TO oral lesion 4-6 times PER DAY, Disp: , Rfl:   fluticasone-salmeterol (ADVAIR) 250-50 mcg/dose diskus inhaler, Inhale 1 puff into the lungs every 12 (twelve) hours., Disp: , Rfl:   folic acid (FOLVITE) 1 MG tablet, TAKE 1 TABLET BY MOUTH DAILY, Disp: , Rfl:   HYDROcodone-acetaminophen (NORCO) 5-325 mg per  tablet, TAKE 1 TABLET BY MOUTH TWICE DAILY AS NEEDED, Disp: 20 tablet, Rfl: 0  hydrOXYzine pamoate (VISTARIL) 25 mg capsule, TAKE ONE CAPSULE BY MOUTH THREE TIMES DAILY AS NEEDED FOR SEVERE itching, Disp: , Rfl:   loratadine (CLARITIN) 10 mg tablet, Take by mouth., Disp: , Rfl:   methotrexate 2.5 MG tablet, TAKE FIVE TABLETS BY MOUTH ONCE A WEEK (ALL ON ONE DAY), Disp: , Rfl:   ondansetron (ZOFRAN) 8 MG tablet, TAKE 1 TABLET BY MOUTH EVERY 8 HOURS AS NEEDED FOR NAUSEA AND VOMITING, Disp: , Rfl:   pantoprazole (PROTONIX) 40 MG tablet, Take 1 tablet (40 mg total) by mouth., Disp: , Rfl:   pravastatin  (PRAVACHOL) 20 MG tablet, TAKE ONE TABLET BY MOUTH DAILY, Disp: , Rfl: 2  tacrolimus (PROGRAF) 1 MG capsule, TAKE ONE CAPSULE AS DIRECTED by physician (Dissolve ONE CAPSULE in ONE liter of water AND SWISH AND spit FOUR TIMES DAILY AS NEEDED, Disp: , Rfl:   triamcinolone (KENALOG) 0.1 % cream, APPLY TO ITCHY SKIN ON BODY TWICE DAILY (AVOID FACE AND GROIN), Disp: , Rfl:   UNABLE TO FIND, APPLY THIN LAYER TO ORAL LESION 4-6 TIMES DAILY, Disp: , Rfl:   VIT C/VIT E/LUTEIN/MIN/OMEGA-3 (OCUVITE ORAL), Take by mouth., Disp: , Rfl:   estradioL (ESTRACE) 0.01 % (0.1 mg/gram) vaginal cream, Place vaginally., Disp: , Rfl:    Physical Exam:   Thin elderly lady in no distress. No palpable adenopathy. Anterior floor of mouth looks excellent. No visible or palpable masses. Right anterior buccal mucosa with 2 areas, 1 with thickening and hyperkeratosis in the 1 posterior to that with some erythroplakia and possible granulation tissue.  Independent Review of Additional Tests or Records:  none  Procedures:  none  Impression & Plans:  No recurrent disease at the initial anterior alveolar site. New anterior buccal lesions of concern. Recommend biopsy with laser ablation under local anesthesia at the outpatient center.

## 2021-09-13 ENCOUNTER — Encounter (HOSPITAL_COMMUNITY): Payer: Self-pay | Admitting: Otolaryngology

## 2021-09-13 ENCOUNTER — Other Ambulatory Visit: Payer: Self-pay

## 2021-09-13 NOTE — Pre-Procedure Instructions (Signed)
[place], [place] [address] [place] [address] Phone: [phone] Fax: [phone]  PCP - Monico Blitz, MD  EKG - 03/17/21  ERAS Protcol - NPO  COVID TEST- N/A  Anesthesia review: Y  Patient verbally denies any shortness of breath, fever, cough and chest pain during phone call   -------------  SDW INSTRUCTIONS given:  Your procedure is scheduled on 09/15/21.  Report to [facility] Main Entrance "A" at [place].M., and check in at the Admitting office.  Call this number if you have problems the morning of surgery:  [phone]   Remember:  Do not eat or drink after midnight the night before your surgery     Take these medicines the morning of surgery with A SIP OF WATER  cetirizine (ZYRTEC)  diphenhydrAMINE (BENADRYL) pantoprazole (PROTONIX) Fluticasone-Salmeterol (ADVAIR)  Polyethyl Glycol-Propyl Glycol (SYSTANE OP) - if needed albuterol (PROVENTIL HFA;VENTOLIN HFA) - if needed (Please bring day of surgery) acetaminophen (TYLENOL) - if needed  As of today, STOP taking any Aspirin (unless otherwise instructed by your surgeon) Aleve, Naproxen, Ibuprofen, Motrin, Advil, Goody's, BC's, all herbal medications, fish oil, and all vitamins.                      Do not wear jewelry, make up, or nail polish            Do not wear lotions, powders, perfumes/colognes, or deodorant.            Do not shave 48 hours prior to surgery.  Men may shave face and neck.            Do not bring valuables to the hospital.            [facility] is not responsible for any belongings or valuables.  Do NOT Smoke (Tobacco/Vaping) 24 hours prior to your procedure If you use a CPAP at night, you may bring all equipment for your overnight stay.   Contacts, glasses, dentures or bridgework may not be worn into surgery.      For patients admitted to the hospital, discharge time will be determined by your treatment team.   Patients discharged the day of  surgery will not be allowed to drive home, and someone needs to stay with them for 24 hours.    Special instructions:   [place]- Preparing For Surgery  Before surgery, you can play an important role. Because skin is not sterile, your skin needs to be as free of germs as possible. You can reduce the number of germs on your skin by washing with CHG (chlorahexidine gluconate) Soap before surgery.  CHG is an antiseptic cleaner which kills germs and bonds with the skin to continue killing germs even after washing.    Oral Hygiene is also important to reduce your risk of infection.  Remember - BRUSH YOUR TEETH THE MORNING OF SURGERY WITH YOUR REGULAR TOOTHPASTE  Please do not use if you have an allergy to CHG or antibacterial soaps. If your skin becomes reddened/irritated stop using the CHG.  Do not shave (including legs and underarms) for at least 48 hours prior to first CHG shower. It is OK to shave your face.  Please follow these instructions carefully.   Shower the NIGHT BEFORE SURGERY and the MORNING OF SURGERY with DIAL Soap.   Pat yourself dry with a CLEAN TOWEL.  Wear CLEAN PAJAMAS to bed the night before surgery  Place  CLEAN SHEETS on your bed the night of your first shower and DO NOT SLEEP WITH PETS.   Day of Surgery: Please shower morning of surgery  Wear Clean/Comfortable clothing the morning of surgery Do not apply any deodorants/lotions.   Remember to brush your teeth WITH YOUR REGULAR TOOTHPASTE.   Questions were answered. Patient verbalized understanding of instructions.

## 2021-09-14 NOTE — Anesthesia Preprocedure Evaluation (Addendum)
Anesthesia Evaluation  Patient identified by MRN, date of birth, ID band Patient awake    Reviewed: Allergy & Precautions, NPO status , Patient's Chart, lab work & pertinent test results  History of Anesthesia Complications Negative for: history of anesthetic complications  Airway Mallampati: III  TM Distance: >3 FB Neck ROM: Full    Dental   Pulmonary asthma , COPD, former smoker,    Pulmonary exam normal        Cardiovascular negative cardio ROS Normal cardiovascular exam   Normal cath 2013   Neuro/Psych Anxiety Depression negative neurological ROS     GI/Hepatic Neg liver ROS, GERD  ,  Endo/Other  negative endocrine ROS  Renal/GU negative Renal ROS  negative genitourinary   Musculoskeletal negative musculoskeletal ROS (+)   Abdominal   Peds  Hematology  (+) Blood dyscrasia, anemia ,   Anesthesia Other Findings  H/o oral cancer s/p multiple excisions  Reproductive/Obstetrics                           Anesthesia Physical Anesthesia Plan  ASA: 3  Anesthesia Plan: MAC   Post-op Pain Management: Precedex   Induction: Intravenous  PONV Risk Score and Plan: 2 and Treatment may vary due to age or medical condition  Airway Management Planned: Natural Airway and Nasal Cannula  Additional Equipment: None  Intra-op Plan:   Post-operative Plan:   Informed Consent: I have reviewed the patients History and Physical, chart, labs and discussed the procedure including the risks, benefits and alternatives for the proposed anesthesia with the patient or authorized representative who has indicated his/her understanding and acceptance.       Plan Discussed with:   Anesthesia Plan Comments: (PAT note written 09/14/2021 by Myra Gianotti, PA-C. )      Anesthesia Quick Evaluation

## 2021-09-14 NOTE — Progress Notes (Signed)
Anesthesia Chart Review: Kathleene Hazel   Case: [id number] Date/Time: 09/15/21 0845   Procedure: BIOPSY AND CO2 LASER ABLATION OF ORAL CANCER   Anesthesia type: Monitor Anesthesia Care   Pre-op diagnosis: Oral leukoplakia; Verrucous carcinoma of oral cavity; Erythroplakia of mouth; Oral cancer   Location: [facility] OR ROOM 04 / [facility]   Surgeons: [name], MD       DISCUSSION:  Patient is a 81 year old female scheduled for the above procedure. Appears she rescheduled from 08/25/21 due to illness.  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 She declined XRT.    History includes former smoker (quit 07/26/99), murmur/MVP, HLD, GERD, asthma, oral cancer (s/p resection right buccogingival SCC with marginal mandibulectomy & CO2 laser ablation of left buccal mucosal lichen planus 2/83/15; oral biopsy with laser ablation oral lesion 01/09/13; oral lesion excision with CO2 laser 11/09/15, 08/13/18, 11/20/18; full thickness skin graft to oral defect 11/23/18; excision oral cancer with debridement of maxillary bone 05/08/19; anterior mandibular alveolectomy with teeth extractions x5 03/19/21; bilateral neck dissection 04/09/21). Normal coronaries (with LAD spasm with injection) by 05/18/12 [facility].   She is a same day work-up. Anesthesia team to evaluate on the day of surgery.    VS: Ht 5\' 7"  (1.702 m)    Wt 36.3 kg    BMI 12.53 kg/m  BP Readings from Last 3 Encounters:  04/12/21 126/81  03/20/21 122/74  03/17/21 (!) 151/88   Pulse Readings from Last 3 Encounters:  04/12/21 95  03/20/21 78  03/17/21 84     PROVIDERS: Monico Blitz, MD is PCP    LABS: For day of surgery as indicated. As of 03/17/21, Cr 0.74, glucose 102, H/H 12.4/38.0, PLT count 269.    IMAGES: CT Soft tissue neck 03/16/21: IMPRESSION: 1. 2.2 cm  malignant appearing lesion centered on the midline mandible alveolar ridge with extensive bony erosion and incisor loosening/loss. Cannot differentiate coaptation from invasion into the tongue. 2. Mildly enlarged bilateral submandibular nodes. 3. Post treatment right buccal space. No evidence of recurrent tumor in this area.     EKG: 03/17/21: Sinus rhythm with frequent Premature ventricular complexes Possible Left atrial enlargement Septal infarct , age undetermined Abnormal ECG Confirmed by Thompson Grayer ([id number]) on 03/17/2021 10:01:57 PM - PVCs are new, but otherwise EKG appears stable when compared to 11/20/18 tracing.     CV: Cardiac cath 05/18/12: Coronary Arteries: Right dominant with no anomalies LM: Normal LAD: Normal patient had spasm of the mid and distal vessel on initial injection Relieved with nitro. Large septal perforator that parallels the LAD             D1- Small and normal             D2- Normal Circumflex: Normal             OM1-  Large branching vessel Normal RCA: Normal             PDA- Normal             PLB- Normal   Ventriculography: EF: 70%, hyperdynamic   Hemodynamics:             Aortic Pressure: 112 64 mmHg             LV Pressure: 114 12  mmHg   Impression:  No significant  CAD.  Spasm with injection relieved with nitro.   Myovue images normal just had ECG changes with Lexiscan       Nuclear stress test 05/17/12 [facility], scanned under Media tab) Although there was no evidence of ischemia on perfusion analysis, the presence of ischemic changes with Lexiscan and positive transient ischemic dilation (TID of 1.29) raise the possibility of balanced ischemia.  Correlate clinically. [She was transferred to [facility] for [place] as outlined above.]     She thought she had an echo > 15 years ago at [facility], now [facility].   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   Tobacco abuse    smoked x 35 years, quit 12 years ago   Wears glasses     Past Surgical History:  Procedure Laterality Date   ABDOMINAL HYSTERECTOMY  09/19/2018   ALVEOLOPLASTY N/A 03/19/2021   Procedure: Alveolectomy, mandibular, anterior, with frozen section;  Surgeon: [name], MD;  Location: [place];  Service: ENT;  Laterality: N/A;   CARDIAC CATHETERIZATION     10/13   DILATION AND CURETTAGE OF UTERUS     EXCISION OF ORAL TUMOR  4/12   rt bucca-squamus cell ca   EXCISION OF TONGUE LESION WITH LASER N/A 01/09/2013   Procedure: ORAL BIOPSY AND LASER ABLATION;  Surgeon: [name], MD;  Location: [place];  Service: ENT;  Laterality: N/A;   EXCISION ORAL LESION WITH CO2 LASER Right 11/09/2015   Procedure: EXCISION ORAL LESION WITH CO2 LASER AND FROZEN SECTION ;  Surgeon: [name], MD;  Location: [place];  Service: ENT;  Laterality: Right;   EXCISION ORAL LESION WITH CO2 LASER N/A 08/13/2018   Procedure: EXCISION/biopsy ORAL LESION WITH CO2 LASER ablation;  Surgeon: [name], MD;  Location: [place];  Service: ENT;  Laterality: N/A;   EXCISION ORAL LESION WITH CO2 LASER N/A 11/20/2018   Procedure: EXCISION ORAL LESION WITH CO2 LASER;  Surgeon: [name], MD;  Location: [place];  Service: ENT;  Laterality: N/A;   EXCISION ORAL TUMOR N/A 05/08/2019   Procedure: EXCISION ORAL CANCER WITH DEBRIDEMENT OF MAXILLARY BONE;  Surgeon: [name], MD;  Location: [place];  Service: ENT;  Laterality: N/A;   EYE SURGERY     [facility] eye surgeries   LEFT HEART CATHETERIZATION WITH CORONARY ANGIOGRAM N/A 05/18/2012   Procedure: LEFT HEART CATHETERIZATION WITH CORONARY ANGIOGRAM;  Surgeon: Josue Hector, MD;  Location:  [facility] CATH LAB;  Service: Cardiovascular;  Laterality: N/A;   RADICAL NECK DISSECTION Bilateral 04/09/2021   Procedure: BILATERAL NECK DISSECTION;  Surgeon: [name], MD;  Location: [place];  Service: ENT;  Laterality: Bilateral;   SKIN FULL THICKNESS GRAFT Right 11/23/2018   Procedure: SKIN GRAFT FULL THICKNESS;  Surgeon: [name], MD;  Location: [place];  Service: ENT;  Laterality: Right;    MEDICATIONS: No current facility-administered medications for this encounter.    acetaminophen (TYLENOL) 500 MG tablet   albuterol (PROVENTIL HFA;VENTOLIN HFA) 108 (90 BASE) MCG/ACT inhaler   amoxicillin (AMOXIL) 500 MG capsule   cetirizine (ZYRTEC) 10 MG tablet   clonazePAM (  KLONOPIN) 0.5 MG tablet   diphenhydrAMINE (BENADRYL) 25 MG tablet   escitalopram (LEXAPRO) 10 MG tablet   fluocinonide ointment (LIDEX) 0.05 %   Fluticasone-Salmeterol (ADVAIR) 250-50 MCG/DOSE AEPB   ondansetron (ZOFRAN) 8 MG tablet   pantoprazole (PROTONIX) 40 MG tablet   Polyethyl Glycol-Propyl Glycol (SYSTANE OP)   pravastatin (PRAVACHOL) 20 MG tablet    Myra Gianotti, PA-C Surgical Short Stay/Anesthesiology [facility] Phone [phone] [facility] Phone [phone] 09/14/2021 10:39 AM

## 2021-09-15 ENCOUNTER — Ambulatory Visit (HOSPITAL_BASED_OUTPATIENT_CLINIC_OR_DEPARTMENT_OTHER): Payer: Medicare Other | Admitting: Certified Registered Nurse Anesthetist

## 2021-09-15 ENCOUNTER — Other Ambulatory Visit: Payer: Self-pay

## 2021-09-15 ENCOUNTER — Encounter (HOSPITAL_COMMUNITY): Payer: Self-pay | Admitting: Otolaryngology

## 2021-09-15 ENCOUNTER — Encounter (HOSPITAL_COMMUNITY)
Admission: RE | Disposition: A | Discharge status: Home / Self Care | Payer: Self-pay | Source: Home / Self Care | Attending: Otolaryngology

## 2021-09-15 ENCOUNTER — Ambulatory Visit (HOSPITAL_COMMUNITY): Payer: Medicare Other | Admitting: Certified Registered Nurse Anesthetist

## 2021-09-15 ENCOUNTER — Ambulatory Visit (HOSPITAL_COMMUNITY)
Admission: RE | Admit: 2021-09-15 | Discharge: 2021-09-15 | Disposition: A | Discharge status: Home / Self Care | Payer: Medicare Other | Attending: Otolaryngology | Admitting: Otolaryngology

## 2021-09-15 DIAGNOSIS — J449 Chronic obstructive pulmonary disease, unspecified: Secondary | ICD-10-CM

## 2021-09-15 DIAGNOSIS — K1321 Leukoplakia of oral mucosa, including tongue: Secondary | ICD-10-CM

## 2021-09-15 DIAGNOSIS — F32A Depression, unspecified: Secondary | ICD-10-CM | POA: Insufficient documentation

## 2021-09-15 DIAGNOSIS — I341 Nonrheumatic mitral (valve) prolapse: Secondary | ICD-10-CM | POA: Insufficient documentation

## 2021-09-15 DIAGNOSIS — E785 Hyperlipidemia, unspecified: Secondary | ICD-10-CM | POA: Diagnosis not present

## 2021-09-15 DIAGNOSIS — Z85818 Personal history of malignant neoplasm of other sites of lip, oral cavity, and pharynx: Secondary | ICD-10-CM | POA: Insufficient documentation

## 2021-09-15 DIAGNOSIS — F418 Other specified anxiety disorders: Secondary | ICD-10-CM | POA: Diagnosis not present

## 2021-09-15 DIAGNOSIS — Z87891 Personal history of nicotine dependence: Secondary | ICD-10-CM | POA: Insufficient documentation

## 2021-09-15 DIAGNOSIS — K219 Gastro-esophageal reflux disease without esophagitis: Secondary | ICD-10-CM | POA: Diagnosis not present

## 2021-09-15 DIAGNOSIS — C069 Malignant neoplasm of mouth, unspecified: Secondary | ICD-10-CM

## 2021-09-15 DIAGNOSIS — C44329 Squamous cell carcinoma of skin of other parts of face: Secondary | ICD-10-CM | POA: Diagnosis not present

## 2021-09-15 DIAGNOSIS — F419 Anxiety disorder, unspecified: Secondary | ICD-10-CM | POA: Insufficient documentation

## 2021-09-15 DIAGNOSIS — D630 Anemia in neoplastic disease: Secondary | ICD-10-CM | POA: Diagnosis not present

## 2021-09-15 DIAGNOSIS — C060 Malignant neoplasm of cheek mucosa: Secondary | ICD-10-CM | POA: Insufficient documentation

## 2021-09-15 HISTORY — PX: EXCISION ORAL LESION WITH CO2 LASER: SHX6461

## 2021-09-15 HISTORY — DX: Anemia, unspecified: D64.9

## 2021-09-15 HISTORY — DX: Chronic obstructive pulmonary disease, unspecified: J44.9

## 2021-09-15 LAB — CBC
HCT: 34.1 % — ABNORMAL LOW (ref 36.0–46.0)
Hemoglobin: 10.7 g/dL — ABNORMAL LOW (ref 12.0–15.0)
MCH: 25.5 pg — ABNORMAL LOW (ref 26.0–34.0)
MCHC: 31.4 g/dL (ref 30.0–36.0)
MCV: 81.2 fL (ref 80.0–100.0)
Platelets: 200 10*3/uL (ref 150–400)
RBC: 4.2 MIL/uL (ref 3.87–5.11)
RDW: 14 % (ref 11.5–15.5)
WBC: 4.3 10*3/uL (ref 4.0–10.5)
nRBC: 0 % (ref 0.0–0.2)

## 2021-09-15 LAB — BASIC METABOLIC PANEL
Anion gap: 9 (ref 5–15)
BUN: 22 mg/dL (ref 8–23)
CO2: 27 mmol/L (ref 22–32)
Calcium: 9.6 mg/dL (ref 8.9–10.3)
Chloride: 97 mmol/L — ABNORMAL LOW (ref 98–111)
Creatinine, Ser: 0.92 mg/dL (ref 0.44–1.00)
GFR, Estimated: >60 mL/min (ref >60)
Glucose, Bld: 98 mg/dL (ref 70–99)
Potassium: 4.6 mmol/L (ref 3.5–5.1)
Sodium: 133 mmol/L — ABNORMAL LOW (ref 135–145)

## 2021-09-15 SURGERY — DESTRUCTION, LESION, MOUTH, USING CO2 LASER
Anesthesia: Monitor Anesthesia Care

## 2021-09-15 MED ORDER — AMISULPRIDE (ANTIEMETIC) 5 MG/2ML IV SOLN: 10.0000 mg | Freq: Once | INTRAVENOUS | Status: DC | PRN

## 2021-09-15 MED ORDER — DEXMEDETOMIDINE (PRECEDEX) IN NS 20 MCG/5ML (4 MCG/ML) IV SYRINGE
PREFILLED_SYRINGE | INTRAVENOUS | Status: AC
  Filled 2021-09-15: qty 5

## 2021-09-15 MED ORDER — LIDOCAINE-EPINEPHRINE 1 %-1:100000 IJ SOLN
INTRAMUSCULAR | Status: AC
  Filled 2021-09-15: qty 1

## 2021-09-15 MED ORDER — DEXMEDETOMIDINE (PRECEDEX) IN NS 20 MCG/5ML (4 MCG/ML) IV SYRINGE
PREFILLED_SYRINGE | INTRAVENOUS | Status: DC | PRN
  Administered 2021-09-15: 4 ug via INTRAVENOUS
  Administered 2021-09-15: 8 ug via INTRAVENOUS
  Administered 2021-09-15: 4 ug via INTRAVENOUS

## 2021-09-15 MED ORDER — OXYCODONE HCL 5 MG PO TABS: 5.0000 mg | ORAL_TABLET | Freq: Once | ORAL | Status: DC | PRN

## 2021-09-15 MED ORDER — FENTANYL CITRATE (PF) 100 MCG/2ML IJ SOLN: 25.0000 ug | INTRAMUSCULAR | Status: DC | PRN

## 2021-09-15 MED ORDER — ORAL CARE MOUTH RINSE
15.0000 mL | Freq: Once | OROMUCOSAL | Status: AC
Start: 2021-09-15 — End: 2021-09-15

## 2021-09-15 MED ORDER — SILVER NITRATE-POT NITRATE 75-25 % EX MISC
CUTANEOUS | Status: AC
  Filled 2021-09-15: qty 10

## 2021-09-15 MED ORDER — EPINEPHRINE HCL (NASAL) 0.1 % NA SOLN
NASAL | Status: AC
  Filled 2021-09-15: qty 30

## 2021-09-15 MED ORDER — LACTATED RINGERS IV SOLN: INTRAVENOUS | Status: DC

## 2021-09-15 MED ORDER — ONDANSETRON HCL 4 MG/2ML IJ SOLN: 4.0000 mg | Freq: Once | INTRAMUSCULAR | Status: DC | PRN

## 2021-09-15 MED ORDER — CHLORHEXIDINE GLUCONATE 0.12 % MT SOLN
15.0000 mL | Freq: Once | OROMUCOSAL | Status: AC
  Administered 2021-09-15: 15 mL via OROMUCOSAL
  Filled 2021-09-15: qty 15

## 2021-09-15 MED ORDER — LIDOCAINE-EPINEPHRINE 1 %-1:100000 IJ SOLN
INTRAMUSCULAR | Status: DC | PRN
  Administered 2021-09-15: 1.5 mL

## 2021-09-15 MED ORDER — FENTANYL CITRATE (PF) 250 MCG/5ML IJ SOLN
INTRAMUSCULAR | Status: AC
  Filled 2021-09-15: qty 5

## 2021-09-15 MED ORDER — OXYCODONE HCL 5 MG/5ML PO SOLN: 5.0000 mg | Freq: Once | ORAL | Status: DC | PRN

## 2021-09-15 SURGICAL SUPPLY — 22 items
BAG COUNTER SPONGE SURGICOUNT (BAG) ×2 IMPLANT
BAG SPNG CNTER NS LX DISP (BAG) ×1
BLADE SURG 15 STRL LF DISP TIS (BLADE) IMPLANT
BLADE SURG 15 STRL SS (BLADE)
CANISTER SUCT 3000ML PPV (MISCELLANEOUS) ×2 IMPLANT
COVER BACK TABLE 60X90IN (DRAPES) ×2 IMPLANT
DRAPE HALF SHEET 40X57 (DRAPES) ×2 IMPLANT
GAUZE SPONGE 4X4 12PLY STRL (GAUZE/BANDAGES/DRESSINGS) ×2 IMPLANT
GLOVE SURG LTX SZ7.5 (GLOVE) ×2 IMPLANT
GUARD TEETH (MISCELLANEOUS) IMPLANT
KIT BASIN OR (CUSTOM PROCEDURE TRAY) ×2 IMPLANT
KIT TURNOVER KIT B (KITS) ×2 IMPLANT
NDL PRECISIONGLIDE 27X1.5 (NEEDLE) IMPLANT
NEEDLE PRECISIONGLIDE 27X1.5 (NEEDLE) IMPLANT
PAD ARMBOARD 7.5X6 YLW CONV (MISCELLANEOUS) ×4 IMPLANT
PUNCH BIOPSY 4MM (MISCELLANEOUS) ×2
PUNCH BIOPSY DISP 4 (MISCELLANEOUS) IMPLANT
SYR CONTROL 10ML LL (SYRINGE) IMPLANT
SYR TB 1ML LUER SLIP (SYRINGE) IMPLANT
TOWEL GREEN STERILE FF (TOWEL DISPOSABLE) ×2 IMPLANT
TUBE CONNECTING 12X1/4 (SUCTIONS) ×2 IMPLANT
WATER STERILE IRR 1000ML POUR (IV SOLUTION) ×2 IMPLANT

## 2021-09-15 NOTE — Anesthesia Procedure Notes (Signed)
Procedure Name: MAC Date/Time: 09/15/2021 9:01 AM Performed by: Dorann Lodge, CRNA Pre-anesthesia Checklist: Patient identified, Emergency Drugs available, Suction available, Patient being monitored and Timeout performed Patient Re-evaluated:Patient Re-evaluated prior to induction Oxygen Delivery Method: Nasal cannula

## 2021-09-15 NOTE — Transfer of Care (Signed)
Immediate Anesthesia Transfer of Care Note  Patient: Meredith Stewart  Procedure(s) Performed: BIOPSY AND CO2 LASER ABLATION OF ORAL CANCER  Patient Location: PACU  Anesthesia Type:MAC  Level of Consciousness: awake and alert   Airway & Oxygen Therapy: Patient Spontanous Breathing  Post-op Assessment: Report given to RN and Post -op Vital signs reviewed and stable  Post vital signs: Reviewed and stable  Last Vitals:  Vitals Value Taken Time  BP 118/69 09/15/21 0927  Temp    Pulse 66 09/15/21 0928  Resp 17 09/15/21 0928  SpO2 100 % 09/15/21 0928  Vitals shown include unvalidated device data.  Last Pain:  Vitals:   09/15/21 0731  TempSrc:   PainSc: 0-No pain         Complications: No notable events documented.

## 2021-09-15 NOTE — Op Note (Signed)
OPERATIVE REPORT  DATE OF SURGERY: 09/15/2021  PATIENT:  Meredith Stewart,  81 y.o. female  PRE-OPERATIVE DIAGNOSIS:  Oral leukoplakia; Verrucous carcinoma of oral cavity; Erythroplakia of mouth; Oral cancer  POST-OPERATIVE DIAGNOSIS:  Oral leukoplakia; Verrucous carcinoma of oral cavity;   PROCEDURE:  Procedure(s): BIOPSY AND CO2 LASER ABLATION OF ORAL CANCER  SURGEON:  Beckie Salts, MD  ASSISTANTS: None  ANESTHESIA:   General   EBL: 20 ml  DRAINS: None  LOCAL MEDICATIONS USED: 1% Xylocaine with epinephrine  SPECIMEN: Right anterior buccal mucosal biopsy  COUNTS:  Correct  PROCEDURE DETAILS: The patient was taken to the operating room and placed on the operating table in the supine position. Following induction of intravenous sedation , the mouth was prepped and draped in a standard fashion.  Laser safe glasses were used throughout the case on the patient.  Towels were placed around the mouth and then saline soaked towels were placed on top of that.  The oral cavity was inspected.  The lesion was identified in the right anterior buccal mucosa.  There were 2 other areas of thickened mucosa along the soft and hard palate also on the right side.  All of these areas were infiltrated with local anesthetic.  A 4 mm dermal punch was used to take a sample of the buccal mucosal lesion.  This was sent for pathological evaluation.  The NP CO2 laser was then used to ablate all of these areas using 2 W continuous power.  The eschar was suctioned off.  Bleeding was controlled using silver nitrate.  She tolerated this very well.  She was transferred out of the operating room in stable condition.    PATIENT DISPOSITION:  To PACU, stable

## 2021-09-15 NOTE — Interval H&P Note (Signed)
History and Physical Interval Note:  09/15/2021 8:45 AM  Meredith Stewart  has presented today for surgery, with the diagnosis of Oral leukoplakia; Verrucous carcinoma of oral cavity; Erythroplakia of mouth; Oral cancer.  The various methods of treatment have been discussed with the patient and family. After consideration of risks, benefits and other options for treatment, the patient has consented to  Procedure(s): BIOPSY AND CO2 LASER ABLATION OF ORAL CANCER (N/A) as a surgical intervention.  The patient's history has been reviewed, patient examined, no change in status, stable for surgery.  I have reviewed the patient's chart and labs.  Questions were answered to the patient's satisfaction.     [name]

## 2021-09-15 NOTE — Anesthesia Postprocedure Evaluation (Signed)
Anesthesia Post Note  Patient: Meredith Stewart  Procedure(s) Performed: BIOPSY AND CO2 LASER ABLATION OF ORAL CANCER     Patient location during evaluation: PACU Anesthesia Type: MAC Level of consciousness: awake and alert Pain management: pain level controlled Vital Signs Assessment: post-procedure vital signs reviewed and stable Respiratory status: spontaneous breathing, nonlabored ventilation and respiratory function stable Cardiovascular status: blood pressure returned to baseline and stable Postop Assessment: no apparent nausea or vomiting Anesthetic complications: no   No notable events documented.  Last Vitals:  Vitals:   09/15/21 0653 09/15/21 0930  BP: (!) 146/79 118/69  Pulse:  68  Resp:  (!) 28  Temp:  36.8 C  SpO2:  100%    Last Pain:  Vitals:   09/15/21 0731  TempSrc:   PainSc: 0-No pain                 Lidia Collum

## 2021-09-15 NOTE — Discharge Instructions (Signed)
Rinse mouth with saline several times daily.  Resume normal diet.

## 2021-09-16 ENCOUNTER — Encounter (HOSPITAL_COMMUNITY): Payer: Self-pay | Admitting: Otolaryngology

## 2021-09-21 DIAGNOSIS — Z681 Body mass index (BMI) 19 or less, adult: Secondary | ICD-10-CM | POA: Diagnosis not present

## 2021-09-21 DIAGNOSIS — J449 Chronic obstructive pulmonary disease, unspecified: Secondary | ICD-10-CM | POA: Diagnosis not present

## 2021-09-21 DIAGNOSIS — Z87891 Personal history of nicotine dependence: Secondary | ICD-10-CM | POA: Diagnosis not present

## 2021-09-21 DIAGNOSIS — R059 Cough, unspecified: Secondary | ICD-10-CM | POA: Diagnosis not present

## 2021-09-21 DIAGNOSIS — Z299 Encounter for prophylactic measures, unspecified: Secondary | ICD-10-CM | POA: Diagnosis not present

## 2021-09-21 LAB — SURGICAL PATHOLOGY

## 2021-09-22 DIAGNOSIS — R059 Cough, unspecified: Secondary | ICD-10-CM | POA: Diagnosis not present

## 2021-09-24 DIAGNOSIS — Z85818 Personal history of malignant neoplasm of other sites of lip, oral cavity, and pharynx: Secondary | ICD-10-CM | POA: Diagnosis not present

## 2021-09-24 DIAGNOSIS — R0902 Hypoxemia: Secondary | ICD-10-CM | POA: Diagnosis not present

## 2021-09-24 DIAGNOSIS — W19XXXA Unspecified fall, initial encounter: Secondary | ICD-10-CM | POA: Diagnosis not present

## 2021-09-24 DIAGNOSIS — Z87891 Personal history of nicotine dependence: Secondary | ICD-10-CM | POA: Diagnosis not present

## 2021-09-24 DIAGNOSIS — Z043 Encounter for examination and observation following other accident: Secondary | ICD-10-CM | POA: Diagnosis not present

## 2021-09-24 DIAGNOSIS — Z85828 Personal history of other malignant neoplasm of skin: Secondary | ICD-10-CM | POA: Diagnosis not present

## 2021-09-24 DIAGNOSIS — Z79899 Other long term (current) drug therapy: Secondary | ICD-10-CM | POA: Diagnosis not present

## 2021-09-24 DIAGNOSIS — E785 Hyperlipidemia, unspecified: Secondary | ICD-10-CM | POA: Diagnosis not present

## 2021-09-24 DIAGNOSIS — Z882 Allergy status to sulfonamides status: Secondary | ICD-10-CM | POA: Diagnosis not present

## 2021-09-24 DIAGNOSIS — S73102A Unspecified sprain of left hip, initial encounter: Secondary | ICD-10-CM | POA: Diagnosis not present

## 2021-09-24 DIAGNOSIS — W07XXXA Fall from chair, initial encounter: Secondary | ICD-10-CM | POA: Diagnosis not present

## 2021-09-24 DIAGNOSIS — Z888 Allergy status to other drugs, medicaments and biological substances status: Secondary | ICD-10-CM | POA: Diagnosis not present

## 2021-09-24 DIAGNOSIS — S76012A Strain of muscle, fascia and tendon of left hip, initial encounter: Secondary | ICD-10-CM | POA: Diagnosis not present

## 2021-09-24 DIAGNOSIS — I10 Essential (primary) hypertension: Secondary | ICD-10-CM | POA: Diagnosis not present

## 2021-09-24 DIAGNOSIS — R52 Pain, unspecified: Secondary | ICD-10-CM | POA: Diagnosis not present

## 2021-09-24 DIAGNOSIS — M25552 Pain in left hip: Secondary | ICD-10-CM | POA: Diagnosis not present

## 2021-11-03 DIAGNOSIS — L110 Acquired keratosis follicularis: Secondary | ICD-10-CM | POA: Diagnosis not present

## 2021-11-03 DIAGNOSIS — M79672 Pain in left foot: Secondary | ICD-10-CM | POA: Diagnosis not present

## 2021-11-03 DIAGNOSIS — M79675 Pain in left toe(s): Secondary | ICD-10-CM | POA: Diagnosis not present

## 2021-11-03 DIAGNOSIS — M79674 Pain in right toe(s): Secondary | ICD-10-CM | POA: Diagnosis not present

## 2021-11-03 DIAGNOSIS — I739 Peripheral vascular disease, unspecified: Secondary | ICD-10-CM | POA: Diagnosis not present

## 2021-11-03 DIAGNOSIS — M79671 Pain in right foot: Secondary | ICD-10-CM | POA: Diagnosis not present

## 2021-11-08 DIAGNOSIS — Z299 Encounter for prophylactic measures, unspecified: Secondary | ICD-10-CM | POA: Diagnosis not present

## 2021-11-08 DIAGNOSIS — Z79899 Other long term (current) drug therapy: Secondary | ICD-10-CM | POA: Diagnosis not present

## 2021-11-08 DIAGNOSIS — Z7189 Other specified counseling: Secondary | ICD-10-CM | POA: Diagnosis not present

## 2021-11-08 DIAGNOSIS — E7800 Pure hypercholesterolemia, unspecified: Secondary | ICD-10-CM | POA: Diagnosis not present

## 2021-11-08 DIAGNOSIS — R5383 Other fatigue: Secondary | ICD-10-CM | POA: Diagnosis not present

## 2021-11-08 DIAGNOSIS — Z1331 Encounter for screening for depression: Secondary | ICD-10-CM | POA: Diagnosis not present

## 2021-11-08 DIAGNOSIS — Z0000 Encounter for general adult medical examination without abnormal findings: Secondary | ICD-10-CM | POA: Diagnosis not present

## 2021-11-08 DIAGNOSIS — Z681 Body mass index (BMI) 19 or less, adult: Secondary | ICD-10-CM | POA: Diagnosis not present

## 2021-12-02 DIAGNOSIS — L438 Other lichen planus: Secondary | ICD-10-CM | POA: Diagnosis not present

## 2022-01-12 DIAGNOSIS — M79671 Pain in right foot: Secondary | ICD-10-CM | POA: Diagnosis not present

## 2022-01-12 DIAGNOSIS — M79675 Pain in left toe(s): Secondary | ICD-10-CM | POA: Diagnosis not present

## 2022-01-12 DIAGNOSIS — M79672 Pain in left foot: Secondary | ICD-10-CM | POA: Diagnosis not present

## 2022-01-12 DIAGNOSIS — L110 Acquired keratosis follicularis: Secondary | ICD-10-CM | POA: Diagnosis not present

## 2022-01-12 DIAGNOSIS — I739 Peripheral vascular disease, unspecified: Secondary | ICD-10-CM | POA: Diagnosis not present

## 2022-01-12 DIAGNOSIS — M79674 Pain in right toe(s): Secondary | ICD-10-CM | POA: Diagnosis not present

## 2022-02-15 DIAGNOSIS — Z299 Encounter for prophylactic measures, unspecified: Secondary | ICD-10-CM | POA: Diagnosis not present

## 2022-02-15 DIAGNOSIS — E43 Unspecified severe protein-calorie malnutrition: Secondary | ICD-10-CM | POA: Diagnosis not present

## 2022-02-15 DIAGNOSIS — C069 Malignant neoplasm of mouth, unspecified: Secondary | ICD-10-CM | POA: Diagnosis not present

## 2022-02-15 DIAGNOSIS — F411 Generalized anxiety disorder: Secondary | ICD-10-CM | POA: Diagnosis not present

## 2022-02-15 DIAGNOSIS — L439 Lichen planus, unspecified: Secondary | ICD-10-CM | POA: Diagnosis not present

## 2022-02-15 DIAGNOSIS — Z681 Body mass index (BMI) 19 or less, adult: Secondary | ICD-10-CM | POA: Diagnosis not present

## 2022-03-19 IMAGING — CT CT NECK W/ CM
3 of 4 series · 14 of 33 positions shown, 17 images · IV contrast (Omnipaque or Isovue)
Comparison: None.

CLINICAL DATA: New knot behind the lower front teeth for 1 month.
History of squamous cell carcinoma with excision.

EXAM:
CT NECK WITH CONTRAST
TECHNIQUE: Multidetector CT imaging of the neck was performed using the
standard protocol following the bolus administration of intravenous
contrast.
CONTRAST:  75mL OMNIPAQUE IOHEXOL 350 MG/ML SOLN

[Series 4: cor neck · coronal · 0.36mm/px · 3 of 99 slices shown]
[im 20/99  bone]
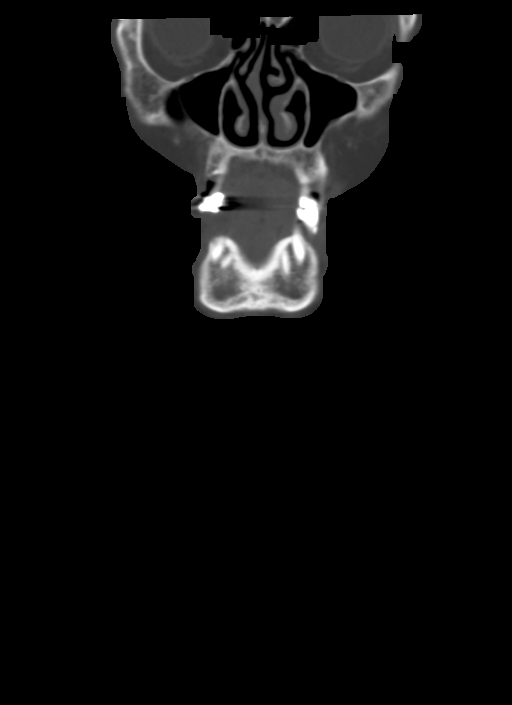
[im 40/99  bone]
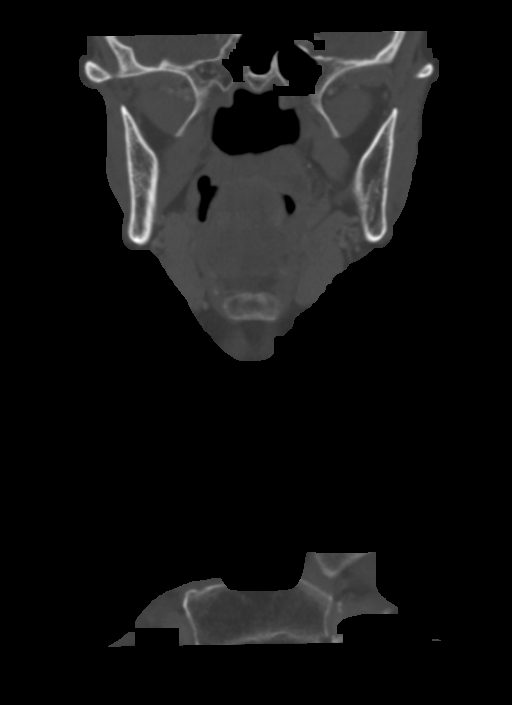
[im 59/99  bone]
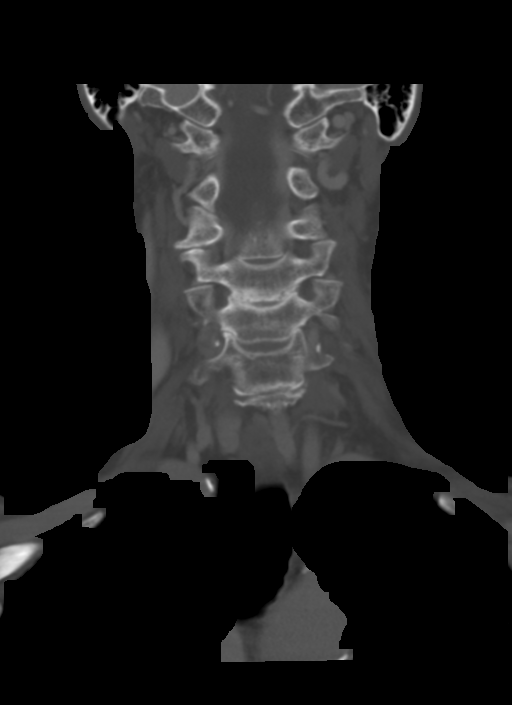

[Series 5: sag neck · sagittal · 0.42mm/px · 5 of 101 slices shown, 6 images]
[im 34/101  bone]
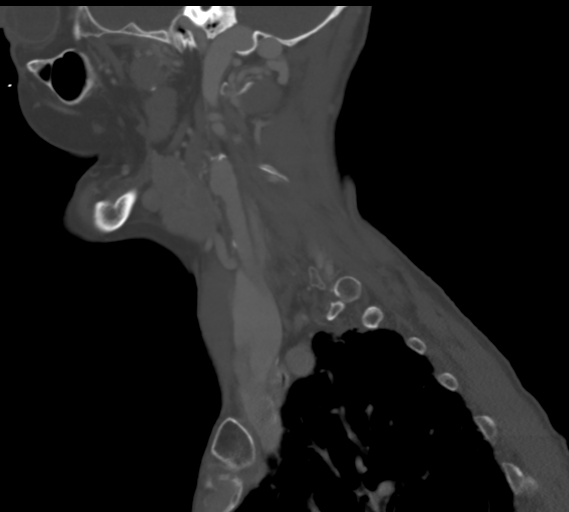
[im 42/101  bone]
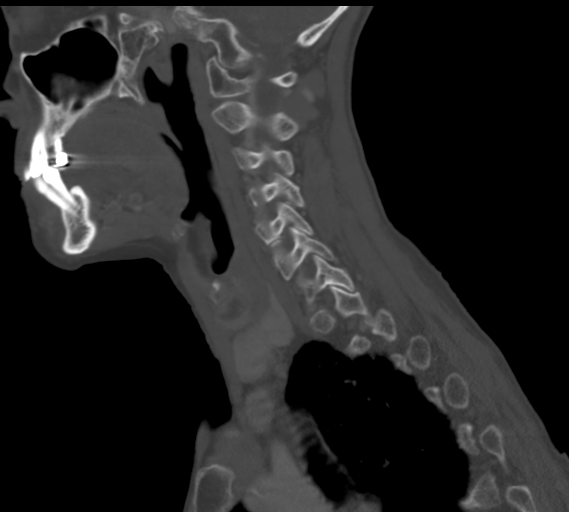
[im 51/101  soft-tissue]
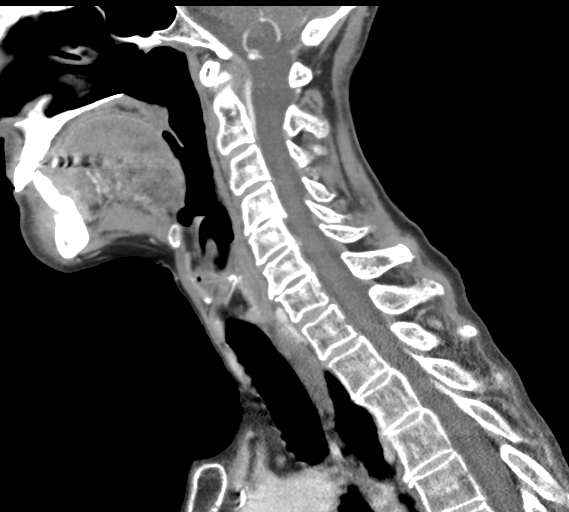
[im 51/101  bone]
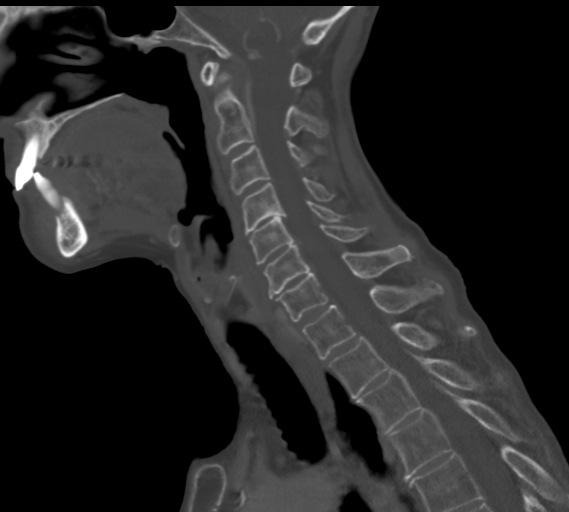
[im 59/101  bone]
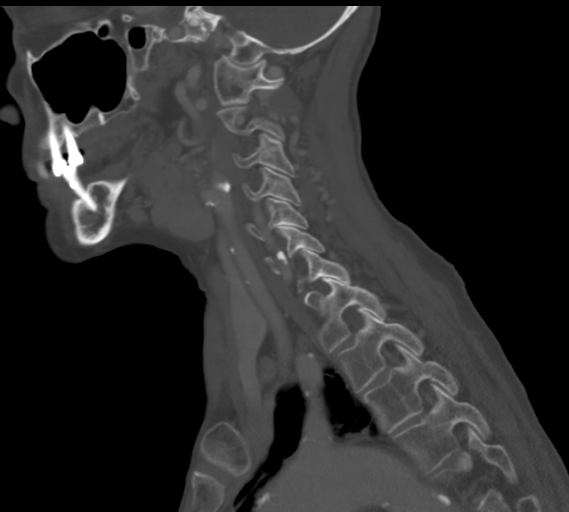
[im 67/101  bone]
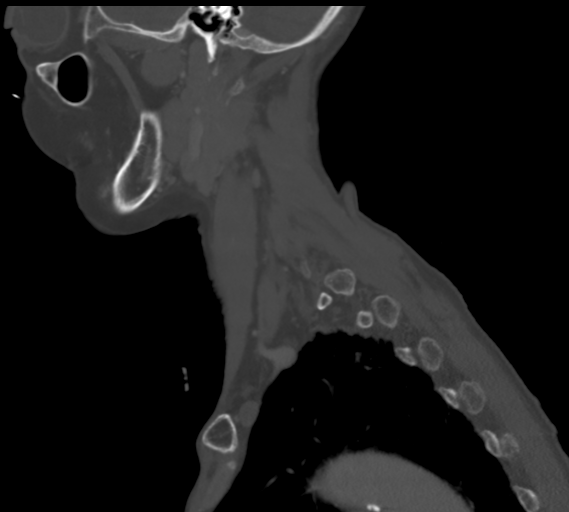

[Series 6: ax oropharynx · axial · 0.39mm/px · z∈[+1200,+1381]mm · 6 of 131 slices shown, 8 images]
[im 19/131  soft-tissue]
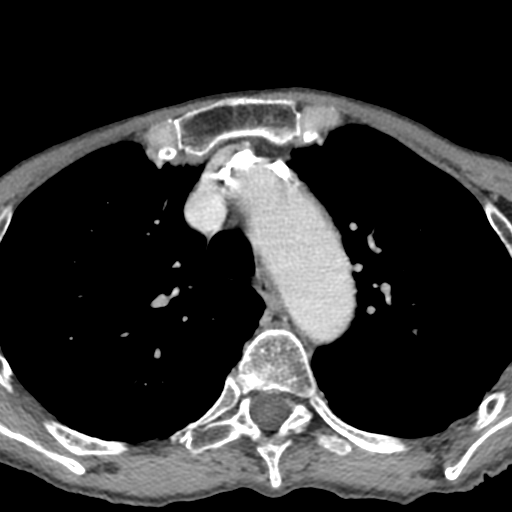
[im 19/131  bone]
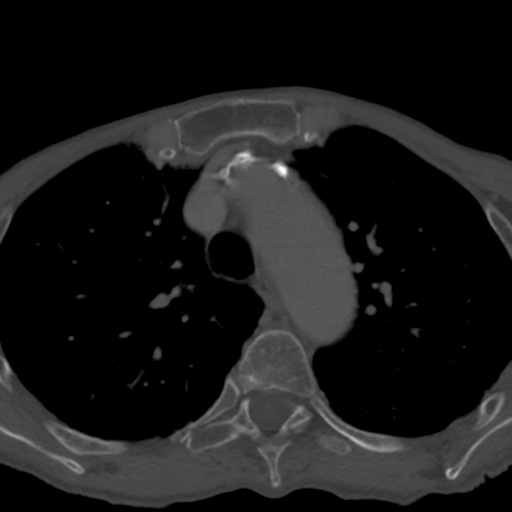
[im 38/131  bone]
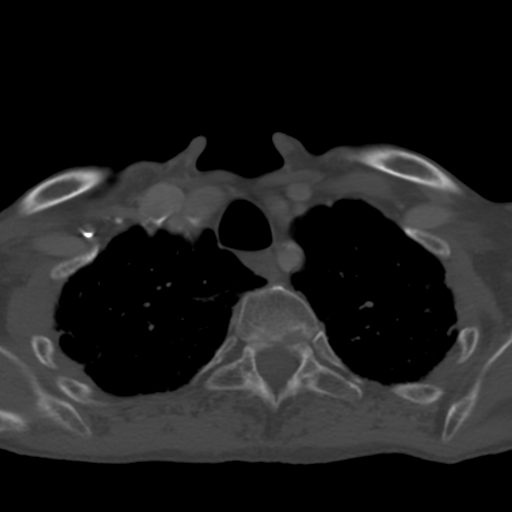
[im 56/131  bone]
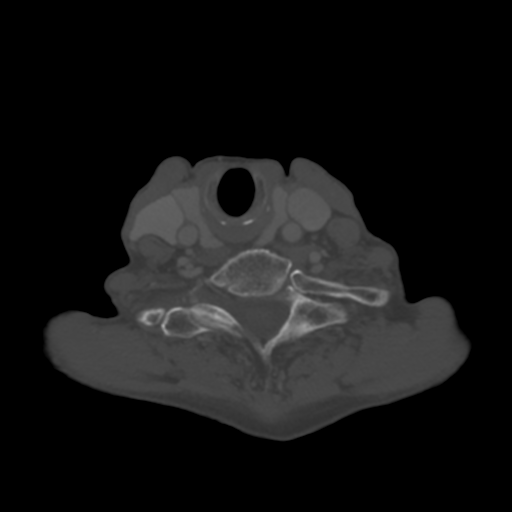
[im 75/131  bone]
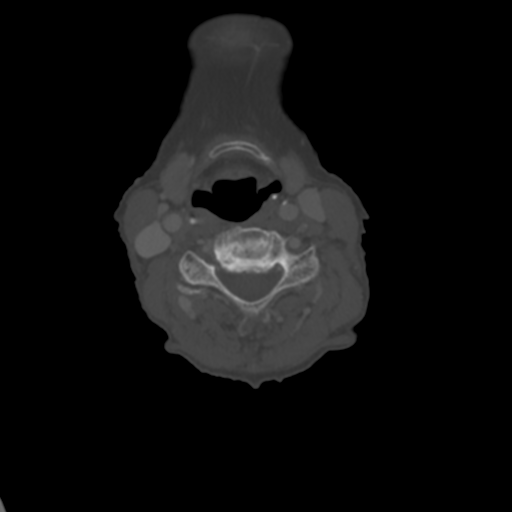
[im 93/131  soft-tissue]
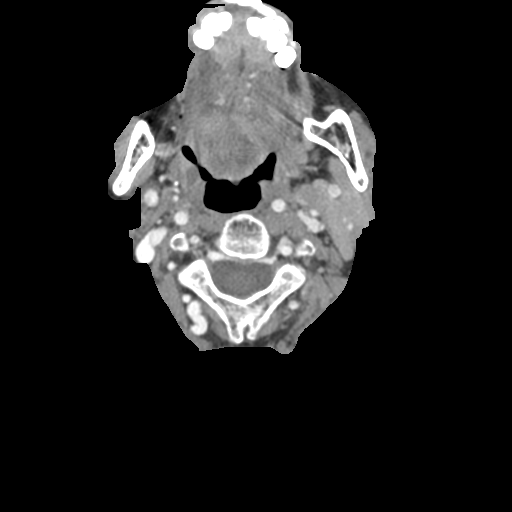
[im 93/131  bone]
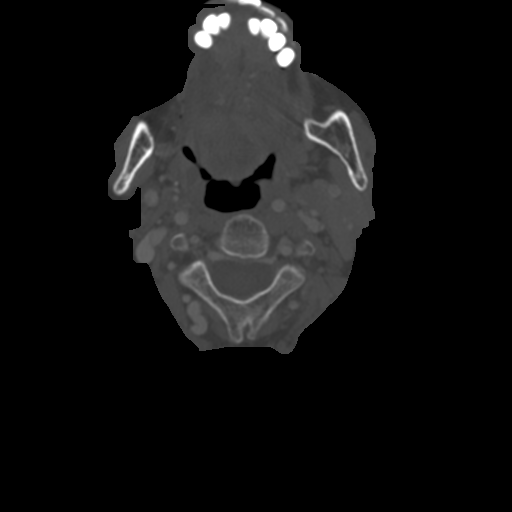
[im 112/131  bone]
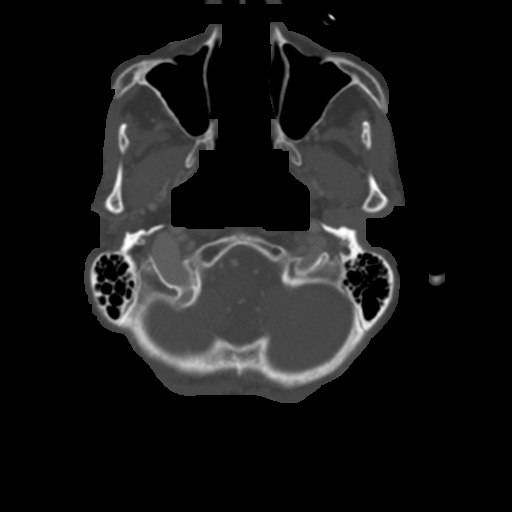

[14 of 33 positions shown; findings below may reference images not displayed]

FINDINGS: Pharynx and larynx: In the oral cavity is an anterior midline mass
which grows through the eroded alveolar ridge of the mandible with
loose right lower central incisor and missing left lower central
incisor. Mass measures at least 2.2 cm on sagittal images.
Relationship with the root of tongue is uncertain and would be
better assessed on exam.

Absent buccal musculature on the right which is presumably post
treatment. No evidence of mass at this level.

Salivary glands: Right parotid atrophy.

Thyroid: Subcentimeter thyroid nodules. No followup recommended
(ref: [HOSPITAL]. [DATE]): 143-50).

Lymph nodes: Prominent size submandibular nodes measuring up to 12
mm long axis on the right and 10 mm long axis on the left.

Vascular: Tortuous bilateral ICA.  Expected atheromatous plaque

Limited intracranial: Negative

Visualized orbits: Negative other than bilateral cataract resection

Mastoids and visualized paranasal sinuses: Clear

Skeleton: Erosion at the level of the midline maxilla alveolar ridge

Upper chest: Biapical subpleural scarring.
IMPRESSION: 1. 2.2 cm malignant appearing lesion centered on the midline
mandible alveolar ridge with extensive bony erosion and incisor
loosening/loss. Cannot differentiate coaptation from invasion into
the tongue.
2. Mildly enlarged bilateral submandibular nodes.
3. Post treatment right buccal space. No evidence of recurrent tumor
in this area.

## 2022-03-23 DIAGNOSIS — L110 Acquired keratosis follicularis: Secondary | ICD-10-CM | POA: Diagnosis not present

## 2022-03-23 DIAGNOSIS — M79675 Pain in left toe(s): Secondary | ICD-10-CM | POA: Diagnosis not present

## 2022-03-23 DIAGNOSIS — M79672 Pain in left foot: Secondary | ICD-10-CM | POA: Diagnosis not present

## 2022-03-23 DIAGNOSIS — I739 Peripheral vascular disease, unspecified: Secondary | ICD-10-CM | POA: Diagnosis not present

## 2022-03-23 DIAGNOSIS — M79671 Pain in right foot: Secondary | ICD-10-CM | POA: Diagnosis not present

## 2022-03-23 DIAGNOSIS — M79674 Pain in right toe(s): Secondary | ICD-10-CM | POA: Diagnosis not present

## 2022-05-10 DIAGNOSIS — Z299 Encounter for prophylactic measures, unspecified: Secondary | ICD-10-CM | POA: Diagnosis not present

## 2022-05-10 DIAGNOSIS — Z79899 Other long term (current) drug therapy: Secondary | ICD-10-CM | POA: Diagnosis not present

## 2022-05-10 DIAGNOSIS — R5383 Other fatigue: Secondary | ICD-10-CM | POA: Diagnosis not present

## 2022-05-10 DIAGNOSIS — C069 Malignant neoplasm of mouth, unspecified: Secondary | ICD-10-CM | POA: Diagnosis not present

## 2022-05-10 DIAGNOSIS — F411 Generalized anxiety disorder: Secondary | ICD-10-CM | POA: Diagnosis not present

## 2022-05-11 DIAGNOSIS — N3281 Overactive bladder: Secondary | ICD-10-CM | POA: Diagnosis not present

## 2022-05-11 DIAGNOSIS — C069 Malignant neoplasm of mouth, unspecified: Secondary | ICD-10-CM | POA: Diagnosis not present

## 2022-05-11 DIAGNOSIS — J449 Chronic obstructive pulmonary disease, unspecified: Secondary | ICD-10-CM | POA: Diagnosis not present

## 2022-05-11 DIAGNOSIS — F411 Generalized anxiety disorder: Secondary | ICD-10-CM | POA: Diagnosis not present

## 2022-05-21 DIAGNOSIS — C069 Malignant neoplasm of mouth, unspecified: Secondary | ICD-10-CM | POA: Diagnosis not present

## 2022-05-21 DIAGNOSIS — J449 Chronic obstructive pulmonary disease, unspecified: Secondary | ICD-10-CM | POA: Diagnosis not present

## 2022-05-21 DIAGNOSIS — K219 Gastro-esophageal reflux disease without esophagitis: Secondary | ICD-10-CM | POA: Diagnosis not present

## 2022-05-21 DIAGNOSIS — L439 Lichen planus, unspecified: Secondary | ICD-10-CM | POA: Diagnosis not present

## 2022-05-21 DIAGNOSIS — M810 Age-related osteoporosis without current pathological fracture: Secondary | ICD-10-CM | POA: Diagnosis not present

## 2022-05-21 DIAGNOSIS — H353290 Exudative age-related macular degeneration, unspecified eye, stage unspecified: Secondary | ICD-10-CM | POA: Diagnosis not present

## 2022-05-21 DIAGNOSIS — N814 Uterovaginal prolapse, unspecified: Secondary | ICD-10-CM | POA: Diagnosis not present

## 2022-05-21 DIAGNOSIS — E7800 Pure hypercholesterolemia, unspecified: Secondary | ICD-10-CM | POA: Diagnosis not present

## 2022-05-21 DIAGNOSIS — G43909 Migraine, unspecified, not intractable, without status migrainosus: Secondary | ICD-10-CM | POA: Diagnosis not present

## 2022-05-21 DIAGNOSIS — Z515 Encounter for palliative care: Secondary | ICD-10-CM | POA: Diagnosis not present

## 2022-05-23 DIAGNOSIS — G43909 Migraine, unspecified, not intractable, without status migrainosus: Secondary | ICD-10-CM | POA: Diagnosis not present

## 2022-05-23 DIAGNOSIS — J449 Chronic obstructive pulmonary disease, unspecified: Secondary | ICD-10-CM | POA: Diagnosis not present

## 2022-05-23 DIAGNOSIS — N814 Uterovaginal prolapse, unspecified: Secondary | ICD-10-CM | POA: Diagnosis not present

## 2022-05-23 DIAGNOSIS — E7800 Pure hypercholesterolemia, unspecified: Secondary | ICD-10-CM | POA: Diagnosis not present

## 2022-05-23 DIAGNOSIS — M810 Age-related osteoporosis without current pathological fracture: Secondary | ICD-10-CM | POA: Diagnosis not present

## 2022-05-23 DIAGNOSIS — C069 Malignant neoplasm of mouth, unspecified: Secondary | ICD-10-CM | POA: Diagnosis not present

## 2022-05-24 DIAGNOSIS — N814 Uterovaginal prolapse, unspecified: Secondary | ICD-10-CM | POA: Diagnosis not present

## 2022-05-24 DIAGNOSIS — G43909 Migraine, unspecified, not intractable, without status migrainosus: Secondary | ICD-10-CM | POA: Diagnosis not present

## 2022-05-24 DIAGNOSIS — E7800 Pure hypercholesterolemia, unspecified: Secondary | ICD-10-CM | POA: Diagnosis not present

## 2022-05-24 DIAGNOSIS — J449 Chronic obstructive pulmonary disease, unspecified: Secondary | ICD-10-CM | POA: Diagnosis not present

## 2022-05-24 DIAGNOSIS — M810 Age-related osteoporosis without current pathological fracture: Secondary | ICD-10-CM | POA: Diagnosis not present

## 2022-05-24 DIAGNOSIS — C069 Malignant neoplasm of mouth, unspecified: Secondary | ICD-10-CM | POA: Diagnosis not present

## 2022-05-25 DIAGNOSIS — M810 Age-related osteoporosis without current pathological fracture: Secondary | ICD-10-CM | POA: Diagnosis not present

## 2022-05-25 DIAGNOSIS — E7800 Pure hypercholesterolemia, unspecified: Secondary | ICD-10-CM | POA: Diagnosis not present

## 2022-05-25 DIAGNOSIS — H353290 Exudative age-related macular degeneration, unspecified eye, stage unspecified: Secondary | ICD-10-CM | POA: Diagnosis not present

## 2022-05-25 DIAGNOSIS — L439 Lichen planus, unspecified: Secondary | ICD-10-CM | POA: Diagnosis not present

## 2022-05-25 DIAGNOSIS — Z515 Encounter for palliative care: Secondary | ICD-10-CM | POA: Diagnosis not present

## 2022-05-25 DIAGNOSIS — N814 Uterovaginal prolapse, unspecified: Secondary | ICD-10-CM | POA: Diagnosis not present

## 2022-05-25 DIAGNOSIS — K219 Gastro-esophageal reflux disease without esophagitis: Secondary | ICD-10-CM | POA: Diagnosis not present

## 2022-05-25 DIAGNOSIS — C069 Malignant neoplasm of mouth, unspecified: Secondary | ICD-10-CM | POA: Diagnosis not present

## 2022-05-25 DIAGNOSIS — J449 Chronic obstructive pulmonary disease, unspecified: Secondary | ICD-10-CM | POA: Diagnosis not present

## 2022-05-25 DIAGNOSIS — G43909 Migraine, unspecified, not intractable, without status migrainosus: Secondary | ICD-10-CM | POA: Diagnosis not present

## 2022-05-31 DIAGNOSIS — J449 Chronic obstructive pulmonary disease, unspecified: Secondary | ICD-10-CM | POA: Diagnosis not present

## 2022-05-31 DIAGNOSIS — C069 Malignant neoplasm of mouth, unspecified: Secondary | ICD-10-CM | POA: Diagnosis not present

## 2022-05-31 DIAGNOSIS — G43909 Migraine, unspecified, not intractable, without status migrainosus: Secondary | ICD-10-CM | POA: Diagnosis not present

## 2022-05-31 DIAGNOSIS — N814 Uterovaginal prolapse, unspecified: Secondary | ICD-10-CM | POA: Diagnosis not present

## 2022-05-31 DIAGNOSIS — M810 Age-related osteoporosis without current pathological fracture: Secondary | ICD-10-CM | POA: Diagnosis not present

## 2022-05-31 DIAGNOSIS — E7800 Pure hypercholesterolemia, unspecified: Secondary | ICD-10-CM | POA: Diagnosis not present

## 2022-06-01 DIAGNOSIS — M79674 Pain in right toe(s): Secondary | ICD-10-CM | POA: Diagnosis not present

## 2022-06-01 DIAGNOSIS — M79671 Pain in right foot: Secondary | ICD-10-CM | POA: Diagnosis not present

## 2022-06-01 DIAGNOSIS — M79675 Pain in left toe(s): Secondary | ICD-10-CM | POA: Diagnosis not present

## 2022-06-01 DIAGNOSIS — L110 Acquired keratosis follicularis: Secondary | ICD-10-CM | POA: Diagnosis not present

## 2022-06-01 DIAGNOSIS — I739 Peripheral vascular disease, unspecified: Secondary | ICD-10-CM | POA: Diagnosis not present

## 2022-06-01 DIAGNOSIS — M79672 Pain in left foot: Secondary | ICD-10-CM | POA: Diagnosis not present

## 2022-06-03 DIAGNOSIS — G43909 Migraine, unspecified, not intractable, without status migrainosus: Secondary | ICD-10-CM | POA: Diagnosis not present

## 2022-06-03 DIAGNOSIS — J449 Chronic obstructive pulmonary disease, unspecified: Secondary | ICD-10-CM | POA: Diagnosis not present

## 2022-06-03 DIAGNOSIS — C069 Malignant neoplasm of mouth, unspecified: Secondary | ICD-10-CM | POA: Diagnosis not present

## 2022-06-03 DIAGNOSIS — M810 Age-related osteoporosis without current pathological fracture: Secondary | ICD-10-CM | POA: Diagnosis not present

## 2022-06-03 DIAGNOSIS — N814 Uterovaginal prolapse, unspecified: Secondary | ICD-10-CM | POA: Diagnosis not present

## 2022-06-03 DIAGNOSIS — E7800 Pure hypercholesterolemia, unspecified: Secondary | ICD-10-CM | POA: Diagnosis not present

## 2022-06-06 DIAGNOSIS — N814 Uterovaginal prolapse, unspecified: Secondary | ICD-10-CM | POA: Diagnosis not present

## 2022-06-06 DIAGNOSIS — J449 Chronic obstructive pulmonary disease, unspecified: Secondary | ICD-10-CM | POA: Diagnosis not present

## 2022-06-06 DIAGNOSIS — M810 Age-related osteoporosis without current pathological fracture: Secondary | ICD-10-CM | POA: Diagnosis not present

## 2022-06-06 DIAGNOSIS — C069 Malignant neoplasm of mouth, unspecified: Secondary | ICD-10-CM | POA: Diagnosis not present

## 2022-06-06 DIAGNOSIS — G43909 Migraine, unspecified, not intractable, without status migrainosus: Secondary | ICD-10-CM | POA: Diagnosis not present

## 2022-06-06 DIAGNOSIS — E7800 Pure hypercholesterolemia, unspecified: Secondary | ICD-10-CM | POA: Diagnosis not present

## 2022-06-08 DIAGNOSIS — C069 Malignant neoplasm of mouth, unspecified: Secondary | ICD-10-CM | POA: Diagnosis not present

## 2022-06-08 DIAGNOSIS — M810 Age-related osteoporosis without current pathological fracture: Secondary | ICD-10-CM | POA: Diagnosis not present

## 2022-06-08 DIAGNOSIS — N814 Uterovaginal prolapse, unspecified: Secondary | ICD-10-CM | POA: Diagnosis not present

## 2022-06-08 DIAGNOSIS — E7800 Pure hypercholesterolemia, unspecified: Secondary | ICD-10-CM | POA: Diagnosis not present

## 2022-06-08 DIAGNOSIS — J449 Chronic obstructive pulmonary disease, unspecified: Secondary | ICD-10-CM | POA: Diagnosis not present

## 2022-06-08 DIAGNOSIS — G43909 Migraine, unspecified, not intractable, without status migrainosus: Secondary | ICD-10-CM | POA: Diagnosis not present

## 2022-06-09 DIAGNOSIS — G43909 Migraine, unspecified, not intractable, without status migrainosus: Secondary | ICD-10-CM | POA: Diagnosis not present

## 2022-06-09 DIAGNOSIS — J449 Chronic obstructive pulmonary disease, unspecified: Secondary | ICD-10-CM | POA: Diagnosis not present

## 2022-06-09 DIAGNOSIS — N814 Uterovaginal prolapse, unspecified: Secondary | ICD-10-CM | POA: Diagnosis not present

## 2022-06-09 DIAGNOSIS — C069 Malignant neoplasm of mouth, unspecified: Secondary | ICD-10-CM | POA: Diagnosis not present

## 2022-06-09 DIAGNOSIS — M810 Age-related osteoporosis without current pathological fracture: Secondary | ICD-10-CM | POA: Diagnosis not present

## 2022-06-09 DIAGNOSIS — E7800 Pure hypercholesterolemia, unspecified: Secondary | ICD-10-CM | POA: Diagnosis not present

## 2022-06-13 DIAGNOSIS — C069 Malignant neoplasm of mouth, unspecified: Secondary | ICD-10-CM | POA: Diagnosis not present

## 2022-06-13 DIAGNOSIS — M810 Age-related osteoporosis without current pathological fracture: Secondary | ICD-10-CM | POA: Diagnosis not present

## 2022-06-13 DIAGNOSIS — G43909 Migraine, unspecified, not intractable, without status migrainosus: Secondary | ICD-10-CM | POA: Diagnosis not present

## 2022-06-13 DIAGNOSIS — E7800 Pure hypercholesterolemia, unspecified: Secondary | ICD-10-CM | POA: Diagnosis not present

## 2022-06-13 DIAGNOSIS — J449 Chronic obstructive pulmonary disease, unspecified: Secondary | ICD-10-CM | POA: Diagnosis not present

## 2022-06-13 DIAGNOSIS — N814 Uterovaginal prolapse, unspecified: Secondary | ICD-10-CM | POA: Diagnosis not present

## 2022-06-20 DIAGNOSIS — G43909 Migraine, unspecified, not intractable, without status migrainosus: Secondary | ICD-10-CM | POA: Diagnosis not present

## 2022-06-20 DIAGNOSIS — N814 Uterovaginal prolapse, unspecified: Secondary | ICD-10-CM | POA: Diagnosis not present

## 2022-06-20 DIAGNOSIS — J449 Chronic obstructive pulmonary disease, unspecified: Secondary | ICD-10-CM | POA: Diagnosis not present

## 2022-06-20 DIAGNOSIS — C069 Malignant neoplasm of mouth, unspecified: Secondary | ICD-10-CM | POA: Diagnosis not present

## 2022-06-20 DIAGNOSIS — M810 Age-related osteoporosis without current pathological fracture: Secondary | ICD-10-CM | POA: Diagnosis not present

## 2022-06-20 DIAGNOSIS — E7800 Pure hypercholesterolemia, unspecified: Secondary | ICD-10-CM | POA: Diagnosis not present

## 2022-06-24 DIAGNOSIS — N814 Uterovaginal prolapse, unspecified: Secondary | ICD-10-CM | POA: Diagnosis not present

## 2022-06-24 DIAGNOSIS — G43909 Migraine, unspecified, not intractable, without status migrainosus: Secondary | ICD-10-CM | POA: Diagnosis not present

## 2022-06-24 DIAGNOSIS — L439 Lichen planus, unspecified: Secondary | ICD-10-CM | POA: Diagnosis not present

## 2022-06-24 DIAGNOSIS — C069 Malignant neoplasm of mouth, unspecified: Secondary | ICD-10-CM | POA: Diagnosis not present

## 2022-06-24 DIAGNOSIS — M810 Age-related osteoporosis without current pathological fracture: Secondary | ICD-10-CM | POA: Diagnosis not present

## 2022-06-24 DIAGNOSIS — K219 Gastro-esophageal reflux disease without esophagitis: Secondary | ICD-10-CM | POA: Diagnosis not present

## 2022-06-24 DIAGNOSIS — E7800 Pure hypercholesterolemia, unspecified: Secondary | ICD-10-CM | POA: Diagnosis not present

## 2022-06-24 DIAGNOSIS — J449 Chronic obstructive pulmonary disease, unspecified: Secondary | ICD-10-CM | POA: Diagnosis not present

## 2022-06-24 DIAGNOSIS — H353290 Exudative age-related macular degeneration, unspecified eye, stage unspecified: Secondary | ICD-10-CM | POA: Diagnosis not present

## 2022-06-24 DIAGNOSIS — Z515 Encounter for palliative care: Secondary | ICD-10-CM | POA: Diagnosis not present

## 2022-06-27 DIAGNOSIS — E7800 Pure hypercholesterolemia, unspecified: Secondary | ICD-10-CM | POA: Diagnosis not present

## 2022-06-27 DIAGNOSIS — G43909 Migraine, unspecified, not intractable, without status migrainosus: Secondary | ICD-10-CM | POA: Diagnosis not present

## 2022-06-27 DIAGNOSIS — J449 Chronic obstructive pulmonary disease, unspecified: Secondary | ICD-10-CM | POA: Diagnosis not present

## 2022-06-27 DIAGNOSIS — M810 Age-related osteoporosis without current pathological fracture: Secondary | ICD-10-CM | POA: Diagnosis not present

## 2022-06-27 DIAGNOSIS — N814 Uterovaginal prolapse, unspecified: Secondary | ICD-10-CM | POA: Diagnosis not present

## 2022-06-27 DIAGNOSIS — C069 Malignant neoplasm of mouth, unspecified: Secondary | ICD-10-CM | POA: Diagnosis not present

## 2022-07-04 DIAGNOSIS — G43909 Migraine, unspecified, not intractable, without status migrainosus: Secondary | ICD-10-CM | POA: Diagnosis not present

## 2022-07-04 DIAGNOSIS — N814 Uterovaginal prolapse, unspecified: Secondary | ICD-10-CM | POA: Diagnosis not present

## 2022-07-04 DIAGNOSIS — M810 Age-related osteoporosis without current pathological fracture: Secondary | ICD-10-CM | POA: Diagnosis not present

## 2022-07-04 DIAGNOSIS — E7800 Pure hypercholesterolemia, unspecified: Secondary | ICD-10-CM | POA: Diagnosis not present

## 2022-07-04 DIAGNOSIS — C069 Malignant neoplasm of mouth, unspecified: Secondary | ICD-10-CM | POA: Diagnosis not present

## 2022-07-04 DIAGNOSIS — J449 Chronic obstructive pulmonary disease, unspecified: Secondary | ICD-10-CM | POA: Diagnosis not present

## 2022-07-11 DIAGNOSIS — G43909 Migraine, unspecified, not intractable, without status migrainosus: Secondary | ICD-10-CM | POA: Diagnosis not present

## 2022-07-11 DIAGNOSIS — M810 Age-related osteoporosis without current pathological fracture: Secondary | ICD-10-CM | POA: Diagnosis not present

## 2022-07-11 DIAGNOSIS — C069 Malignant neoplasm of mouth, unspecified: Secondary | ICD-10-CM | POA: Diagnosis not present

## 2022-07-11 DIAGNOSIS — J449 Chronic obstructive pulmonary disease, unspecified: Secondary | ICD-10-CM | POA: Diagnosis not present

## 2022-07-11 DIAGNOSIS — E7800 Pure hypercholesterolemia, unspecified: Secondary | ICD-10-CM | POA: Diagnosis not present

## 2022-07-11 DIAGNOSIS — N814 Uterovaginal prolapse, unspecified: Secondary | ICD-10-CM | POA: Diagnosis not present

## 2022-07-15 DIAGNOSIS — M810 Age-related osteoporosis without current pathological fracture: Secondary | ICD-10-CM | POA: Diagnosis not present

## 2022-07-15 DIAGNOSIS — E7800 Pure hypercholesterolemia, unspecified: Secondary | ICD-10-CM | POA: Diagnosis not present

## 2022-07-15 DIAGNOSIS — N814 Uterovaginal prolapse, unspecified: Secondary | ICD-10-CM | POA: Diagnosis not present

## 2022-07-15 DIAGNOSIS — C069 Malignant neoplasm of mouth, unspecified: Secondary | ICD-10-CM | POA: Diagnosis not present

## 2022-07-15 DIAGNOSIS — G43909 Migraine, unspecified, not intractable, without status migrainosus: Secondary | ICD-10-CM | POA: Diagnosis not present

## 2022-07-15 DIAGNOSIS — J449 Chronic obstructive pulmonary disease, unspecified: Secondary | ICD-10-CM | POA: Diagnosis not present

## 2022-07-16 DIAGNOSIS — E7800 Pure hypercholesterolemia, unspecified: Secondary | ICD-10-CM | POA: Diagnosis not present

## 2022-07-16 DIAGNOSIS — G43909 Migraine, unspecified, not intractable, without status migrainosus: Secondary | ICD-10-CM | POA: Diagnosis not present

## 2022-07-16 DIAGNOSIS — C069 Malignant neoplasm of mouth, unspecified: Secondary | ICD-10-CM | POA: Diagnosis not present

## 2022-07-16 DIAGNOSIS — M810 Age-related osteoporosis without current pathological fracture: Secondary | ICD-10-CM | POA: Diagnosis not present

## 2022-07-16 DIAGNOSIS — J449 Chronic obstructive pulmonary disease, unspecified: Secondary | ICD-10-CM | POA: Diagnosis not present

## 2022-07-16 DIAGNOSIS — N814 Uterovaginal prolapse, unspecified: Secondary | ICD-10-CM | POA: Diagnosis not present

## 2022-07-17 DIAGNOSIS — M810 Age-related osteoporosis without current pathological fracture: Secondary | ICD-10-CM | POA: Diagnosis not present

## 2022-07-17 DIAGNOSIS — N814 Uterovaginal prolapse, unspecified: Secondary | ICD-10-CM | POA: Diagnosis not present

## 2022-07-17 DIAGNOSIS — C069 Malignant neoplasm of mouth, unspecified: Secondary | ICD-10-CM | POA: Diagnosis not present

## 2022-07-17 DIAGNOSIS — G43909 Migraine, unspecified, not intractable, without status migrainosus: Secondary | ICD-10-CM | POA: Diagnosis not present

## 2022-07-17 DIAGNOSIS — E7800 Pure hypercholesterolemia, unspecified: Secondary | ICD-10-CM | POA: Diagnosis not present

## 2022-07-17 DIAGNOSIS — J449 Chronic obstructive pulmonary disease, unspecified: Secondary | ICD-10-CM | POA: Diagnosis not present

## 2022-07-18 DIAGNOSIS — G43909 Migraine, unspecified, not intractable, without status migrainosus: Secondary | ICD-10-CM | POA: Diagnosis not present

## 2022-07-18 DIAGNOSIS — M810 Age-related osteoporosis without current pathological fracture: Secondary | ICD-10-CM | POA: Diagnosis not present

## 2022-07-18 DIAGNOSIS — C069 Malignant neoplasm of mouth, unspecified: Secondary | ICD-10-CM | POA: Diagnosis not present

## 2022-07-18 DIAGNOSIS — E7800 Pure hypercholesterolemia, unspecified: Secondary | ICD-10-CM | POA: Diagnosis not present

## 2022-07-18 DIAGNOSIS — N814 Uterovaginal prolapse, unspecified: Secondary | ICD-10-CM | POA: Diagnosis not present

## 2022-07-18 DIAGNOSIS — J449 Chronic obstructive pulmonary disease, unspecified: Secondary | ICD-10-CM | POA: Diagnosis not present

## 2022-07-19 DIAGNOSIS — G43909 Migraine, unspecified, not intractable, without status migrainosus: Secondary | ICD-10-CM | POA: Diagnosis not present

## 2022-07-19 DIAGNOSIS — M810 Age-related osteoporosis without current pathological fracture: Secondary | ICD-10-CM | POA: Diagnosis not present

## 2022-07-19 DIAGNOSIS — J449 Chronic obstructive pulmonary disease, unspecified: Secondary | ICD-10-CM | POA: Diagnosis not present

## 2022-07-19 DIAGNOSIS — E7800 Pure hypercholesterolemia, unspecified: Secondary | ICD-10-CM | POA: Diagnosis not present

## 2022-07-19 DIAGNOSIS — N814 Uterovaginal prolapse, unspecified: Secondary | ICD-10-CM | POA: Diagnosis not present

## 2022-07-19 DIAGNOSIS — C069 Malignant neoplasm of mouth, unspecified: Secondary | ICD-10-CM | POA: Diagnosis not present

## 2022-07-20 DIAGNOSIS — G43909 Migraine, unspecified, not intractable, without status migrainosus: Secondary | ICD-10-CM | POA: Diagnosis not present

## 2022-07-20 DIAGNOSIS — J449 Chronic obstructive pulmonary disease, unspecified: Secondary | ICD-10-CM | POA: Diagnosis not present

## 2022-07-20 DIAGNOSIS — E7800 Pure hypercholesterolemia, unspecified: Secondary | ICD-10-CM | POA: Diagnosis not present

## 2022-07-20 DIAGNOSIS — M810 Age-related osteoporosis without current pathological fracture: Secondary | ICD-10-CM | POA: Diagnosis not present

## 2022-07-20 DIAGNOSIS — C069 Malignant neoplasm of mouth, unspecified: Secondary | ICD-10-CM | POA: Diagnosis not present

## 2022-07-20 DIAGNOSIS — N814 Uterovaginal prolapse, unspecified: Secondary | ICD-10-CM | POA: Diagnosis not present

## 2022-07-21 DIAGNOSIS — M810 Age-related osteoporosis without current pathological fracture: Secondary | ICD-10-CM | POA: Diagnosis not present

## 2022-07-21 DIAGNOSIS — E7800 Pure hypercholesterolemia, unspecified: Secondary | ICD-10-CM | POA: Diagnosis not present

## 2022-07-21 DIAGNOSIS — G43909 Migraine, unspecified, not intractable, without status migrainosus: Secondary | ICD-10-CM | POA: Diagnosis not present

## 2022-07-21 DIAGNOSIS — C069 Malignant neoplasm of mouth, unspecified: Secondary | ICD-10-CM | POA: Diagnosis not present

## 2022-07-21 DIAGNOSIS — J449 Chronic obstructive pulmonary disease, unspecified: Secondary | ICD-10-CM | POA: Diagnosis not present

## 2022-07-21 DIAGNOSIS — N814 Uterovaginal prolapse, unspecified: Secondary | ICD-10-CM | POA: Diagnosis not present

## 2022-07-22 DIAGNOSIS — M810 Age-related osteoporosis without current pathological fracture: Secondary | ICD-10-CM | POA: Diagnosis not present

## 2022-07-22 DIAGNOSIS — J449 Chronic obstructive pulmonary disease, unspecified: Secondary | ICD-10-CM | POA: Diagnosis not present

## 2022-07-22 DIAGNOSIS — E7800 Pure hypercholesterolemia, unspecified: Secondary | ICD-10-CM | POA: Diagnosis not present

## 2022-07-22 DIAGNOSIS — N814 Uterovaginal prolapse, unspecified: Secondary | ICD-10-CM | POA: Diagnosis not present

## 2022-07-22 DIAGNOSIS — C069 Malignant neoplasm of mouth, unspecified: Secondary | ICD-10-CM | POA: Diagnosis not present

## 2022-07-22 DIAGNOSIS — G43909 Migraine, unspecified, not intractable, without status migrainosus: Secondary | ICD-10-CM | POA: Diagnosis not present

## 2022-07-23 DIAGNOSIS — E7800 Pure hypercholesterolemia, unspecified: Secondary | ICD-10-CM | POA: Diagnosis not present

## 2022-07-23 DIAGNOSIS — G43909 Migraine, unspecified, not intractable, without status migrainosus: Secondary | ICD-10-CM | POA: Diagnosis not present

## 2022-07-23 DIAGNOSIS — C069 Malignant neoplasm of mouth, unspecified: Secondary | ICD-10-CM | POA: Diagnosis not present

## 2022-07-23 DIAGNOSIS — M810 Age-related osteoporosis without current pathological fracture: Secondary | ICD-10-CM | POA: Diagnosis not present

## 2022-07-23 DIAGNOSIS — J449 Chronic obstructive pulmonary disease, unspecified: Secondary | ICD-10-CM | POA: Diagnosis not present

## 2022-07-23 DIAGNOSIS — N814 Uterovaginal prolapse, unspecified: Secondary | ICD-10-CM | POA: Diagnosis not present

## 2022-07-24 DIAGNOSIS — E7800 Pure hypercholesterolemia, unspecified: Secondary | ICD-10-CM | POA: Diagnosis not present

## 2022-07-24 DIAGNOSIS — J449 Chronic obstructive pulmonary disease, unspecified: Secondary | ICD-10-CM | POA: Diagnosis not present

## 2022-07-24 DIAGNOSIS — M810 Age-related osteoporosis without current pathological fracture: Secondary | ICD-10-CM | POA: Diagnosis not present

## 2022-07-24 DIAGNOSIS — G43909 Migraine, unspecified, not intractable, without status migrainosus: Secondary | ICD-10-CM | POA: Diagnosis not present

## 2022-07-24 DIAGNOSIS — C069 Malignant neoplasm of mouth, unspecified: Secondary | ICD-10-CM | POA: Diagnosis not present

## 2022-07-24 DIAGNOSIS — N814 Uterovaginal prolapse, unspecified: Secondary | ICD-10-CM | POA: Diagnosis not present

## 2022-07-25 DIAGNOSIS — N814 Uterovaginal prolapse, unspecified: Secondary | ICD-10-CM | POA: Diagnosis not present

## 2022-07-25 DIAGNOSIS — M810 Age-related osteoporosis without current pathological fracture: Secondary | ICD-10-CM | POA: Diagnosis not present

## 2022-07-25 DIAGNOSIS — C069 Malignant neoplasm of mouth, unspecified: Secondary | ICD-10-CM | POA: Diagnosis not present

## 2022-07-25 DIAGNOSIS — J449 Chronic obstructive pulmonary disease, unspecified: Secondary | ICD-10-CM | POA: Diagnosis not present

## 2022-07-25 DIAGNOSIS — E7800 Pure hypercholesterolemia, unspecified: Secondary | ICD-10-CM | POA: Diagnosis not present

## 2022-07-25 DIAGNOSIS — K219 Gastro-esophageal reflux disease without esophagitis: Secondary | ICD-10-CM | POA: Diagnosis not present

## 2022-07-25 DIAGNOSIS — H353290 Exudative age-related macular degeneration, unspecified eye, stage unspecified: Secondary | ICD-10-CM | POA: Diagnosis not present

## 2022-07-25 DIAGNOSIS — G43909 Migraine, unspecified, not intractable, without status migrainosus: Secondary | ICD-10-CM | POA: Diagnosis not present

## 2022-07-25 DIAGNOSIS — Z515 Encounter for palliative care: Secondary | ICD-10-CM | POA: Diagnosis not present

## 2022-07-25 DIAGNOSIS — L439 Lichen planus, unspecified: Secondary | ICD-10-CM | POA: Diagnosis not present

## 2022-08-25 DEATH — deceased
